# Patient Record
Sex: Male | Born: 1980 | Race: Black or African American | Hispanic: No | Marital: Single | Smoking: Current every day smoker
Health system: Southern US, Community
[De-identification: ages and names within clinical notes are randomized; demographics above are authoritative.]

## PROBLEM LIST (undated history)

## (undated) DIAGNOSIS — A6000 Herpesviral infection of urogenital system, unspecified: Secondary | ICD-10-CM

## (undated) DIAGNOSIS — F1910 Other psychoactive substance abuse, uncomplicated: Secondary | ICD-10-CM

## (undated) DIAGNOSIS — G43909 Migraine, unspecified, not intractable, without status migrainosus: Secondary | ICD-10-CM

## (undated) DIAGNOSIS — F1420 Cocaine dependence, uncomplicated: Secondary | ICD-10-CM

---

## 2012-07-31 ENCOUNTER — Emergency Department (HOSPITAL_COMMUNITY)

## 2012-07-31 ENCOUNTER — Emergency Department (HOSPITAL_COMMUNITY)
Admission: EM | Admit: 2012-07-31 | Discharge: 2012-07-31 | Disposition: A | Discharge status: Home / Self Care | Attending: Emergency Medicine | Admitting: Emergency Medicine

## 2012-07-31 ENCOUNTER — Emergency Department (HOSPITAL_COMMUNITY)
Admit: 2012-07-31 | Discharge: 2012-07-31 | Disposition: A | Discharge status: Home / Self Care | Attending: Emergency Medicine | Admitting: Emergency Medicine

## 2012-07-31 ENCOUNTER — Encounter (HOSPITAL_COMMUNITY): Payer: Self-pay | Admitting: *Deleted

## 2012-07-31 DIAGNOSIS — H538 Other visual disturbances: Secondary | ICD-10-CM | POA: Insufficient documentation

## 2012-07-31 DIAGNOSIS — N50819 Testicular pain, unspecified: Secondary | ICD-10-CM

## 2012-07-31 DIAGNOSIS — N509 Disorder of male genital organs, unspecified: Secondary | ICD-10-CM | POA: Insufficient documentation

## 2012-07-31 DIAGNOSIS — R51 Headache: Secondary | ICD-10-CM | POA: Insufficient documentation

## 2012-07-31 DIAGNOSIS — R079 Chest pain, unspecified: Secondary | ICD-10-CM | POA: Insufficient documentation

## 2012-07-31 LAB — CBC
MCV: 83.6 fL (ref 78.0–100.0)
Platelets: 195 10*3/uL (ref 150–400)
RDW: 12.7 % (ref 11.5–15.5)
WBC: 5.1 10*3/uL (ref 4.0–10.5)

## 2012-07-31 LAB — BASIC METABOLIC PANEL
Calcium: 10.1 mg/dL (ref 8.4–10.5)
Chloride: 103 mEq/L (ref 96–112)
Creatinine, Ser: 1.09 mg/dL (ref 0.50–1.35)
GFR calc Af Amer: >90 mL/min (ref >90)
GFR calc non Af Amer: 88 mL/min — ABNORMAL LOW (ref >90)

## 2012-07-31 LAB — TROPONIN I: Troponin I: <0.3 ng/mL (ref <0.30)

## 2012-07-31 MED ORDER — DEXAMETHASONE SODIUM PHOSPHATE 4 MG/ML IJ SOLN
10.0000 mg | Freq: Once | INTRAMUSCULAR | Status: AC
  Administered 2012-07-31: 10 mg via INTRAVENOUS
  Filled 2012-07-31: qty 3

## 2012-07-31 MED ORDER — IBUPROFEN 800 MG PO TABS: 800.0000 mg | ORAL_TABLET | Freq: Three times a day (TID) | ORAL | Status: DC

## 2012-07-31 MED ORDER — METOCLOPRAMIDE HCL 5 MG/ML IJ SOLN
10.0000 mg | Freq: Once | INTRAMUSCULAR | Status: AC
  Administered 2012-07-31: 10 mg via INTRAVENOUS
  Filled 2012-07-31: qty 2

## 2012-07-31 MED ORDER — SODIUM CHLORIDE 0.9 % IV SOLN
INTRAVENOUS | Status: DC
  Administered 2012-07-31: 1000 mL via INTRAVENOUS

## 2012-07-31 MED ORDER — DIPHENHYDRAMINE HCL 50 MG/ML IJ SOLN
25.0000 mg | Freq: Once | INTRAMUSCULAR | Status: AC
  Administered 2012-07-31: 25 mg via INTRAVENOUS
  Filled 2012-07-31: qty 1

## 2012-07-31 NOTE — ED Provider Notes (Signed)
Received pt at shift change.  US scrotum ordered for right testicular pain, currently pending.   US Scrotum 07/31/2012  *RADIOLOGY REPORT*  Clinical Data:  History epididymitis.  Right testicular pain.  SCROTAL ULTRASOUND DOPPLER ULTRASOUND OF THE TESTICLES  Technique: Complete ultrasound examination of the testicles, epididymis, and other scrotal structures was performed.  Color and spectral Doppler ultrasound were also utilized to evaluate blood flow to the testicles.  Comparison:  None  Findings:  Right testis:  Normal in size and homogeneous echotexture measuring 4.4 x 2.0 x 3.3 centimeters.  There are several small calcifications within the testicle.  Left testis:  Normal in size and homogeneous echotexture measuring 4.6 x 1.0 x 2.7 cm.  Small calcifications are present.  Right epididymis:  Normal size is slightly heterogeneous echo texture.  No significant hyperemia. There is small cystic lesion measuring 3 mm likely representing benign epididymal cyst.  Left epididymis:  Normal in size and slightly heterogeneous echotexture.  Hydrocele:  Small left hydrocele.  Varicocele:  None  Pulsed Doppler interrogation of both testes demonstrates low resistance flow bilaterally.  IMPRESSION:  1.  No evidence of testicular torsion or mass. 2.  Heterogeneous right and  left epididymis with no  increased flow.  Findings may represent resolving epididymitis or early epididymitis. 3.  Bilateral microcalcifications of the testicle. 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  Monthly testicular self-examination and annual physical exams are considered appropriate surveillance. If patient has other risk factors for testicular carcinoma, then referral to Urology should be considered.  (Reference:  DeCastro, et al.:  A 5-Year Followup Study of  Asymptomatic Men with Testicular Microlithiasis.  J Urol 2008; 179:1420-1423.)   Original Report Authenticated By: Genevive Bi, M.D.     Pt already taking bactrim for the past several days for testicular pain which seems to be improving per [place] above; will have pt continue it.  Pt remains stable otherwise. Will d/c back to jail.    The patient appears reasonably screened and/or stabilized for discharge and I doubt any other medical condition or other [facility] requiring further screening, evaluation, or treatment in the ED at this time prior to discharge.   Laray Anger, DO 07/31/12 1031

## 2012-07-31 NOTE — ED Notes (Signed)
Pt resting w/ eyes closed. Prison guards remain w/ pt. NAD noted. No needs voiced.

## 2012-07-31 NOTE — ED Provider Notes (Signed)
History     CSN: [id number]  Arrival date & time 07/31/12  0027   First MD Initiated Contact with Patient 07/31/12 0042      Chief Complaint  Patient presents with  . Torticollis  . Blurred Vision  . Chest Pain    (Consider location/radiation/quality/duration/timing/severity/associated sxs/prior treatment) HPI HX per PT, is incarcerated. R testicle pain for the last 2 weeks, evaluated in jail and started on Cipro. He took this for 2 days and stopped due to heart racing, CP and felt like he was having a reaction to that medication. No fevers, trauma or penile drip.  He was switched to Bactrim and has been taking that since.  Today developed HA gradual onset and worsening with some blurry vision and is brought here for evaluation. HA is throbbing and MOD to severe. No weakness or numbness. No speech or gait difficulty.   History reviewed. No pertinent past medical history.  History reviewed. No pertinent past surgical history.  No family history on file.  History  Substance Use Topics  . Smoking status: Never Smoker   . Smokeless tobacco: Not on file  . Alcohol Use: No      Review of Systems  Constitutional: Negative for fever and chills.  HENT: Negative for neck pain and neck stiffness.   Eyes: Negative for pain.  Respiratory: Negative for shortness of breath.   Cardiovascular: Positive for chest pain.  Gastrointestinal: Negative for abdominal pain.  Genitourinary: Positive for testicular pain. Negative for dysuria, flank pain, discharge, penile swelling, scrotal swelling, genital sores and penile pain.  Musculoskeletal: Negative for back pain.  Skin: Negative for rash.  Neurological: Positive for headaches.  All other systems reviewed and are negative.    Allergies  Ciprofloxacin and Keflex  Home Medications  No current outpatient prescriptions on file.  BP 167/89  Pulse 89  Temp 98.1 F (36.7 C) (Oral)  Resp 18  Ht 5\' 11"  (1.803 m)  Wt 240 lb (108.863  kg)  BMI 33.47 kg/m2  SpO2 100%  Physical Exam  Constitutional: He is oriented to person, place, and time. He appears well-developed and well-nourished.  HENT:  Head: Normocephalic and atraumatic.  Eyes: Conjunctivae normal and EOM are normal. Pupils are equal, round, and reactive to light.  Neck: Trachea normal. Neck supple. No thyromegaly present.  Cardiovascular: Normal rate, regular rhythm, S1 normal, S2 normal and normal pulses.     No systolic murmur is present   No diastolic murmur is present  Pulses:      Radial pulses are 2+ on the right side, and 2+ on the left side.  Pulmonary/Chest: Effort normal and breath sounds normal. He has no wheezes. He has no rhonchi. He has no rales. He exhibits no tenderness.  Abdominal: Soft. Normal appearance and bowel sounds are normal. There is no tenderness. There is no CVA tenderness and negative Murphy's sign.  Genitourinary:       R testicle mild tenderness, no obvious mass. No GU lesions  Musculoskeletal:       BLE:s Calves nontender, no cords or erythema, negative Homans sign  Neurological: He is alert and oriented to person, place, and time. He has normal strength. No cranial nerve deficit or sensory deficit. GCS eye subscore is 4. GCS verbal subscore is 5. GCS motor subscore is 6.  Skin: Skin is warm and dry. No rash noted. He is not diaphoretic.  Psychiatric: His speech is normal.       Cooperative and appropriate  ED Course  Procedures (including critical care time)  Results for orders placed during the hospital encounter of 07/31/12  CBC      Component Value Range   WBC 5.1  4.0 - 10.5 K/uL   RBC 5.43  4.22 - 5.81 MIL/uL   Hemoglobin 15.7  13.0 - 17.0 g/dL   HCT 16.1  09.6 - 04.5 %   MCV 83.6  78.0 - 100.0 fL   MCH 28.9  26.0 - 34.0 pg   MCHC 34.6  30.0 - 36.0 g/dL   RDW 40.9  81.1 - 91.4 %   Platelets 195  150 - 400 K/uL  BASIC METABOLIC PANEL      Component Value Range   Sodium 140  135 - 145 mEq/L   Potassium 3.4  (*) 3.5 - 5.1 mEq/L   Chloride 103  96 - 112 mEq/L   CO2 28  19 - 32 mEq/L   Glucose, Bld 82  70 - 99 mg/dL   BUN 7  6 - 23 mg/dL   Creatinine, Ser 7.82  0.50 - 1.35 mg/dL   Calcium 95.6  8.4 - 21.3 mg/dL   GFR calc non Af Amer 88 (*) >90 mL/min   GFR calc Af Amer >90  >90 mL/min  TROPONIN I      Component Value Range   Troponin I <0.30  <0.30 ng/mL   Dg Chest 2 View  07/31/2012  *RADIOLOGY REPORT*  Clinical Data: Central chest pain.  History of smoking.  CHEST - 2 VIEW  Comparison: None.  Findings: The lungs are well-aerated and clear.  There is no evidence of focal opacification, pleural effusion or pneumothorax.  The heart is normal in size; the mediastinal contour is within normal limits.  No acute osseous abnormalities are seen.  IMPRESSION: No acute cardiopulmonary process seen.  No displaced rib fractures identified.   Original Report Authenticated By: Tonia Ghent, M.D.    Ct Head Wo Contrast  07/31/2012  *RADIOLOGY REPORT*  Clinical Data: Blurred vision; chest pain.  Torticollis.  CT HEAD WITHOUT CONTRAST  Technique:  Contiguous axial images were obtained from the base of the skull through the vertex without contrast.  Comparison: None.  Findings: There is no evidence of acute infarction, mass lesion, or intra- or extra-axial hemorrhage on CT.  The posterior fossa, including the cerebellum, brainstem and fourth ventricle, is within normal limits.  The third and lateral ventricles, and basal ganglia are unremarkable in appearance.  The cerebral hemispheres are symmetric in appearance, with normal gray- white differentiation.  No mass effect or midline shift is seen.  There is no evidence of fracture; visualized osseous structures are unremarkable in appearance.  The orbits are within normal limits. The paranasal sinuses and mastoid air cells are well-aerated.  No significant soft tissue abnormalities are seen.  IMPRESSION: Unremarkable noncontrast CT of the head.   Original Report  Authenticated By: Tonia Ghent, M.D.      Date: 07/31/2012  Rate: 80  Rhythm: normal sinus rhythm  QRS Axis: normal  Intervals: normal  ST/T Wave abnormalities: nonspecific ST changes  Conduction Disutrbances:none  Narrative Interpretation:   Old EKG Reviewed: none available   IVfs and HA cocktail: decadron, benadryl, reglan  Recheck after medications - HA resolved. No change normal neuro exam.  VAs reviewed. 20/70 both eyes independently and 20/50 both eyes.   Testicle US ordered and pending at 7am.   MDM   CP, HA and testicle pain adult male currently incarcerated, on ABx  for testicle pain. Imaging. Labs. IVfs and medications as above.         Sunnie Nielsen, MD 07/31/12 [phone]

## 2012-07-31 NOTE — ED Notes (Signed)
Pt back from u/s at this time. Officers from jail at [facility] with pt. nad noted. Pt shackled to bed by officers cuffs.

## 2012-07-31 NOTE — ED Notes (Signed)
Pt reports a headache, blurred vision & chest pain. Thinking it was from an antibiotic he started & had a reaction from.

## 2014-01-30 ENCOUNTER — Encounter (HOSPITAL_COMMUNITY): Payer: Self-pay | Admitting: Emergency Medicine

## 2014-01-30 DIAGNOSIS — F17200 Nicotine dependence, unspecified, uncomplicated: Secondary | ICD-10-CM | POA: Insufficient documentation

## 2014-01-30 DIAGNOSIS — Z79899 Other long term (current) drug therapy: Secondary | ICD-10-CM | POA: Insufficient documentation

## 2014-01-30 DIAGNOSIS — I10 Essential (primary) hypertension: Secondary | ICD-10-CM | POA: Insufficient documentation

## 2014-01-30 DIAGNOSIS — N508 Other specified disorders of male genital organs: Secondary | ICD-10-CM | POA: Insufficient documentation

## 2014-01-30 DIAGNOSIS — Z8619 Personal history of other infectious and parasitic diseases: Secondary | ICD-10-CM | POA: Insufficient documentation

## 2014-01-30 NOTE — ED Notes (Signed)
Pt reports intermittent left testicle pain x 5 year; Told it was an cyst but denies follow up. States pain is now 9/10, denies taking anything. Pt also reports hematuria, dysuria, and frequency since AM. Denies abdominal pain. Denies N/V/D. Denies fever/chills.

## 2014-01-31 ENCOUNTER — Emergency Department (HOSPITAL_COMMUNITY)
Admission: EM | Admit: 2014-01-31 | Discharge: 2014-01-31 | Disposition: A | Discharge status: Home / Self Care | Attending: Emergency Medicine | Admitting: Emergency Medicine

## 2014-01-31 ENCOUNTER — Encounter (HOSPITAL_COMMUNITY): Payer: Self-pay | Admitting: Emergency Medicine

## 2014-01-31 DIAGNOSIS — N5089 Other specified disorders of the male genital organs: Secondary | ICD-10-CM

## 2014-01-31 HISTORY — DX: Herpesviral infection of urogenital system, unspecified: A60.00

## 2014-01-31 LAB — URINALYSIS, ROUTINE W REFLEX MICROSCOPIC
Bilirubin Urine: NEGATIVE
GLUCOSE, UA: NEGATIVE mg/dL
HGB URINE DIPSTICK: NEGATIVE
Ketones, ur: NEGATIVE mg/dL
LEUKOCYTES UA: NEGATIVE
Nitrite: NEGATIVE
PH: 5.5 (ref 5.0–8.0)
Protein, ur: NEGATIVE mg/dL
Specific Gravity, Urine: 1.03 (ref 1.005–1.030)
Urobilinogen, UA: 1 mg/dL (ref 0.0–1.0)

## 2014-01-31 LAB — CBG MONITORING, ED: GLUCOSE-CAPILLARY: 92 mg/dL (ref 70–99)

## 2014-01-31 MED ORDER — OXYCODONE-ACETAMINOPHEN 5-325 MG PO TABS
1.0000 | ORAL_TABLET | Freq: Once | ORAL | Status: AC
  Administered 2014-01-31: 1 via ORAL
  Filled 2014-01-31: qty 1

## 2014-01-31 MED ORDER — IBUPROFEN 800 MG PO TABS
800.0000 mg | ORAL_TABLET | Freq: Once | ORAL | Status: AC
  Administered 2014-01-31: 800 mg via ORAL
  Filled 2014-01-31: qty 1

## 2014-01-31 MED ORDER — IBUPROFEN 200 MG PO TABS
400.0000 mg | ORAL_TABLET | Freq: Once | ORAL | Status: AC
  Administered 2014-01-31: 400 mg via ORAL
  Filled 2014-01-31: qty 2

## 2014-01-31 NOTE — Discharge Instructions (Signed)
Scrotal Masses  Scrotal swelling is common in men of all ages. Common types of testicular masses include:    Hydrocele. The most common benign testicular mass in an adult. Hydroceles are generally soft and painless collections of fluid in the scrotal sac. These can rapidly change size as the fluid enters or leaves. Hydroceles can be associated with an underlying cancer of the testicle.   Spermatoceles. Generally soft and painless cyst-like masses in the scrotum that contain fluid, usually above the testicle. They can rapidly change size as the fluid enters or leaves. They are more prominent while standing or exercising. Sometimes, spermatoceles may cause a sensation of heaviness or a dull ache.   Orchitis. Inflammation of the testicle. It is painful and may be associated with a fever or symptoms of a urinary tract infection, including frequent and painful urination. It is common in males who have the mumps.   Varicocele. An enlargement of the veins that drain the testicles. Varicoceles usually occur on the left side of the scrotum. This condition can increase the risk of infertility. Varicocele is sometimes more prominent while standing or exercising. Sometimes, varicoceles may cause a sensation of heaviness or a dull ache.   Inguinal hernia. A bulge caused by a portion of intestine protruding into the scrotum through a weak area in the abdominal muscles. Hernias may or may not be painful. They are soft and usually enlarge with coughing or straining.   Torsion of the testis. This can cause a testicular mass that develops quickly and is associated with tenderness or fever, or both. It is caused by a twisting of the testicle within the sac. It also reduces the blood supply and can destroy the testis if not treated quickly with surgery.   Epididymitis. Inflammation of the epididymis (a structure attached above and behind the testicle), usually caused by a urinary tract infection or a sexually transmitted  infection. This generally shows up as testicular discomfort and swelling and may include pain during urination. It is frequently associated with a testicle infection.   Testicular appendages. Remnants of tissue on the testis present since birth. A testicular appendage can twist on its blood supply and cause pain. In most cases, this is seen as a blue dot on the scrotum.   Hematocele. A collection of blood between the layers of the sac inside the scrotum. It usually is caused by trauma to the scrotum.   Sebaceous cysts. These can be a swelling in the skin of the scrotum and are usually painless.   Cancer (carcinoma) of the skin of the scrotum. It can cause scrotal swelling, but this is rare.  Document Released: 03/15/2003 Document Revised: 05/11/2013 Document Reviewed: 02/28/2013  [employer] Patient Information 2014 [employer].

## 2014-01-31 NOTE — ED Notes (Signed)
Patient stated that he has been having trouble urinating.  Has been told that he has a cyst in his "testes" and it has really started hurting today.  Stated when he went to the [facility] he noticed some pus before he started to urinate.

## 2014-01-31 NOTE — ED Notes (Signed)
Discharge instructions reviewed with pt. Pt verbalized understanding.   

## 2014-01-31 NOTE — ED Provider Notes (Signed)
CSN: [id number]     Arrival date & time 01/30/14  2044 History   First MD Initiated Contact with Patient 01/31/14 0300         HPI Is reports intermittent left testicle pain over the past 4-5 years.  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  He had an ultrasound in 2013 demonstrated bilateral microcalcifications of his testes.  He reports some hematuria and dysuria as well as frequency without discharge from his penis.  He states the symptoms began today.  He denies pain in his testicles right now.  He reports the pain is located between his 2 testicles.  No scrotal changes per the patient.  No recent fevers or chills.  He is not diabetic.  No other complaints.  Pain is mild in severity   Past Medical History  Diagnosis Date  . Hypertension   . Genital herpes    History reviewed. No pertinent past surgical history. No family history on file. History  Substance Use Topics  . Smoking status: Current Every Day Smoker -- 0.25 packs/day    Types: Cigarettes  . Smokeless tobacco: Not on file  . Alcohol Use: No    Review of Systems  All other systems reviewed and are negative.     Allergies  Ciprofloxacin and Keflex  Home Medications   Prior to Admission medications   Medication Sig Start Date End Date Taking? Authorizing Provider  Homeopathic Products (SINUS MEDICINE PO) Take 1 tablet by mouth every 4 (four) hours as needed (sinus relief).   Yes Historical Provider, MD  ibuprofen (ADVIL,MOTRIN) 800 MG tablet Take 1 tablet (800 mg total) by mouth 3 (three) times daily. 07/31/12  Yes Sunnie NielsenBrian Opitz, MD   BP 116/71  Pulse 69  Temp(Src) 97.5 F (36.4 C) (Oral)  Resp 18  Ht 5\' 11"  (1.803 m)  Wt 250 lb (113.399 kg)  BMI 34.88 kg/m2  SpO2 98% Physical Exam  Nursing note and vitals reviewed. Constitutional: He is oriented to person, place, and time. He appears well-developed and well-nourished.  HENT:  Head: Normocephalic.  Eyes: EOM are normal.  Neck: Normal  range of motion.  Pulmonary/Chest: Effort normal.  Abdominal: He exhibits no distension.  Genitourinary:  Circumcised penis.  No tenderness of the shaft of his penis.  No scrotal swelling or erythema noted.  No scrotal warmth.  Normal left and right testicle without tenderness.  Patient with a small hard mass at the medial aspect of the left testicle but does not appear confluent with the left  testicle by physical exam  Musculoskeletal: Normal range of motion.  Neurological: He is alert and oriented to person, place, and time.  Psychiatric: He has a normal mood and affect.    ED Course  Procedures (including critical care time) Labs Review Labs Reviewed  URINALYSIS, ROUTINE W REFLEX MICROSCOPIC  CBG MONITORING, ED    Imaging Review No results found.   EKG Interpretation None      MDM   Final diagnoses:  Testicular mass    No indication for acute imaging tonight.  Doubt testicular torsion.  Doubt epididymitis.  Discharge home with urology followup.    Lyanne CoKevin M Chasidy Janak, MD 01/31/14 [phone]

## 2014-02-04 ENCOUNTER — Encounter (HOSPITAL_COMMUNITY): Payer: Self-pay | Admitting: Emergency Medicine

## 2014-02-04 ENCOUNTER — Emergency Department (HOSPITAL_COMMUNITY)

## 2014-02-04 ENCOUNTER — Emergency Department (HOSPITAL_COMMUNITY)
Admission: EM | Admit: 2014-02-04 | Discharge: 2014-02-05 | Disposition: A | Discharge status: Home / Self Care | Attending: Emergency Medicine | Admitting: Emergency Medicine

## 2014-02-04 DIAGNOSIS — I10 Essential (primary) hypertension: Secondary | ICD-10-CM | POA: Insufficient documentation

## 2014-02-04 DIAGNOSIS — R519 Headache, unspecified: Secondary | ICD-10-CM

## 2014-02-04 DIAGNOSIS — F17200 Nicotine dependence, unspecified, uncomplicated: Secondary | ICD-10-CM | POA: Insufficient documentation

## 2014-02-04 DIAGNOSIS — H53149 Visual discomfort, unspecified: Secondary | ICD-10-CM | POA: Insufficient documentation

## 2014-02-04 DIAGNOSIS — R0602 Shortness of breath: Secondary | ICD-10-CM | POA: Insufficient documentation

## 2014-02-04 DIAGNOSIS — R079 Chest pain, unspecified: Secondary | ICD-10-CM

## 2014-02-04 DIAGNOSIS — R51 Headache: Secondary | ICD-10-CM | POA: Insufficient documentation

## 2014-02-04 DIAGNOSIS — Z8619 Personal history of other infectious and parasitic diseases: Secondary | ICD-10-CM | POA: Insufficient documentation

## 2014-02-04 DIAGNOSIS — R072 Precordial pain: Secondary | ICD-10-CM | POA: Insufficient documentation

## 2014-02-04 HISTORY — DX: Migraine, unspecified, not intractable, without status migrainosus: G43.909

## 2014-02-04 LAB — CBC
HEMATOCRIT: 46.8 % (ref 39.0–52.0)
HEMOGLOBIN: 15.7 g/dL (ref 13.0–17.0)
MCH: 29 pg (ref 26.0–34.0)
MCHC: 33.5 g/dL (ref 30.0–36.0)
MCV: 86.5 fL (ref 78.0–100.0)
Platelets: 199 10*3/uL (ref 150–400)
RBC: 5.41 MIL/uL (ref 4.22–5.81)
RDW: 13.5 % (ref 11.5–15.5)
WBC: 7.5 10*3/uL (ref 4.0–10.5)

## 2014-02-04 LAB — BASIC METABOLIC PANEL
BUN: 13 mg/dL (ref 6–23)
CHLORIDE: 105 meq/L (ref 96–112)
CO2: 26 meq/L (ref 19–32)
Calcium: 10.2 mg/dL (ref 8.4–10.5)
Creatinine, Ser: 1.03 mg/dL (ref 0.50–1.35)
GFR calc Af Amer: >90 mL/min (ref >90)
GLUCOSE: 115 mg/dL — AB (ref 70–99)
POTASSIUM: 3.9 meq/L (ref 3.7–5.3)
SODIUM: 144 meq/L (ref 137–147)

## 2014-02-04 LAB — URINALYSIS, ROUTINE W REFLEX MICROSCOPIC
Bilirubin Urine: NEGATIVE
Glucose, UA: NEGATIVE mg/dL
Hgb urine dipstick: NEGATIVE
Ketones, ur: NEGATIVE mg/dL
Leukocytes, UA: NEGATIVE
NITRITE: NEGATIVE
PROTEIN: NEGATIVE mg/dL
SPECIFIC GRAVITY, URINE: 1.023 (ref 1.005–1.030)
UROBILINOGEN UA: 1 mg/dL (ref 0.0–1.0)
pH: 6 (ref 5.0–8.0)

## 2014-02-04 LAB — I-STAT TROPONIN, ED: TROPONIN I, POC: 0 ng/mL (ref 0.00–0.08)

## 2014-02-04 MED ORDER — SODIUM CHLORIDE 0.9 % IV BOLUS (SEPSIS)
1000.0000 mL | Freq: Once | INTRAVENOUS | Status: AC
  Administered 2014-02-04: 1000 mL via INTRAVENOUS

## 2014-02-04 MED ORDER — ACETAMINOPHEN 500 MG PO TABS
1000.0000 mg | ORAL_TABLET | Freq: Once | ORAL | Status: AC
  Administered 2014-02-04: 1000 mg via ORAL
  Filled 2014-02-04: qty 2

## 2014-02-04 NOTE — ED Notes (Signed)
Pt reports mid chest pains today, states his pain increases when drinking liquids. Has been working out in the heat today, also having a headache, hx of migraines. Denies n/v. No acute distress noted at triage, ekg done. Airway intact.

## 2014-02-04 NOTE — ED Notes (Addendum)
Pt. said that in prison, a doctor stated he had symptoms of TB/ high WBC count. Information relayed to [employer].

## 2014-02-04 NOTE — Discharge Instructions (Signed)
Chest Wall Pain Chest wall pain is pain in or around the bones and muscles of your chest. It may take up to 6 weeks to get better. It may take longer if you must stay physically active in your work and activities.  CAUSES  Chest wall pain may happen on its own. However, it may be caused by:  A viral illness like the flu.  Injury.  Coughing.  Exercise.  Arthritis.  Fibromyalgia.  Shingles. HOME CARE INSTRUCTIONS   Avoid overtiring physical activity. Try not to strain or perform activities that cause pain. This includes any activities using your chest or your abdominal and side muscles, especially if heavy weights are used.  Put ice on the sore area.  Put ice in a plastic bag.  Place a towel between your skin and the bag.  Leave the ice on for 15-20 minutes per hour while awake for the first 2 days.  Only take over-the-counter or prescription medicines for pain, discomfort, or fever as directed by your caregiver. SEEK IMMEDIATE MEDICAL CARE IF:   Your pain increases, or you are very uncomfortable.  You have a fever.  Your chest pain becomes worse.  You have new, unexplained symptoms.  You have nausea or vomiting.  You feel sweaty or lightheaded.  You have a cough with phlegm (sputum), or you cough up blood. MAKE SURE YOU:   Understand these instructions.  Will watch your condition.  Will get help right away if you are not doing well or get worse. Document Released: 09/08/2005 Document Revised: 12/01/2011 Document Reviewed: 05/05/2011 [facility] Patient Information 2014 [place], [place].  Migraine Headache A migraine headache is an intense, throbbing pain on one or both sides of your head. A migraine can last for 30 minutes to several hours. CAUSES  The exact cause of a migraine headache is not always known. However, a migraine may be caused when nerves in the brain become irritated and release chemicals that cause inflammation. This causes pain. Certain things  may also trigger migraines, such as:  Alcohol.  Smoking.  Stress.  Menstruation.  Aged cheeses.  Foods or drinks that contain nitrates, glutamate, aspartame, or tyramine.  Lack of sleep.  Chocolate.  Caffeine.  Hunger.  Physical exertion.  Fatigue.  Medicines used to treat chest pain (nitroglycerine), birth control pills, estrogen, and some blood pressure medicines. SIGNS AND SYMPTOMS  Pain on one or both sides of your head.  Pulsating or throbbing pain.  Severe pain that prevents daily activities.  Pain that is aggravated by any physical activity.  Nausea, vomiting, or both.  Dizziness.  Pain with exposure to bright lights, loud noises, or activity.  General sensitivity to bright lights, loud noises, or smells. Before you get a migraine, you may get warning signs that a migraine is coming (aura). An aura may include:  Seeing flashing lights.  Seeing bright spots, halos, or zig-zag lines.  Having tunnel vision or blurred vision.  Having feelings of numbness or tingling.  Having trouble talking.  Having muscle weakness. DIAGNOSIS  A migraine headache is often diagnosed based on:  Symptoms.  Physical exam.  A CT scan or MRI of your head. These imaging tests cannot diagnose migraines, but they can help rule out other causes of headaches. TREATMENT Medicines may be given for pain and nausea. Medicines can also be given to help prevent recurrent migraines.  HOME CARE INSTRUCTIONS  Only take over-the-counter or prescription medicines for pain or discomfort as directed by your health care provider. The  use of long-term narcotics is not recommended.  Lie down in a dark, quiet room when you have a migraine.  Keep a journal to find out what may trigger your migraine headaches. For example, write down:  What you eat and drink.  How much sleep you get.  Any change to your diet or medicines.  Limit alcohol consumption.  Quit smoking if you  smoke.  Get 7 9 hours of sleep, or as recommended by your health care provider.  Limit stress.  Keep lights dim if bright lights bother you and make your migraines worse. SEEK IMMEDIATE MEDICAL CARE IF:   Your migraine becomes severe.  You have a fever.  You have a stiff neck.  You have vision loss.  You have muscular weakness or loss of muscle control.  You start losing your balance or have trouble walking.  You feel faint or pass out.  You have severe symptoms that are different from your first symptoms. MAKE SURE YOU:   Understand these instructions.  Will watch your condition.  Will get help right away if you are not doing well or get worse. Document Released: 09/08/2005 Document Revised: 06/29/2013 Document Reviewed: 05/16/2013 [facility] LLCExitCare Patient Information 2014 [place]ExitCare, MarylandLLC.

## 2014-02-04 NOTE — ED Provider Notes (Signed)
CSN: [id number]     Arrival date & time 02/04/14  1849 History   First MD Initiated Contact with Patient 02/04/14 2108     Chief Complaint  Patient presents with  . Chest Pain  . Headache     (Consider location/radiation/quality/duration/timing/severity/associated sxs/prior Treatment) Patient is a 33 y.o. male presenting with chest pain and headaches.  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 Ineffective treatments:  None tried Associated symptoms: headache and shortness of breath ("I think it's because I'm overweignt")   Associated symptoms: no cough, no dizziness, no fever and no nausea   Headache Pain location:  L temporal and R temporal Quality:  Dull Radiates to:  Does not radiate Severity currently:  8/10 Onset quality:  Gradual Duration:  1 day Timing:  Constant Progression:  Unchanged Chronicity:  Recurrent Context comment:  Being outside all day Relieved by:  Nothing Worsened by:  Nothing tried Associated symptoms: photophobia   Associated symptoms: no blurred vision, no cough, no dizziness, no fever and no nausea     Past Medical History  Diagnosis Date  . Hypertension   . Genital herpes   . Migraines    History reviewed. No pertinent past surgical history. History reviewed. No pertinent family history. History  Substance Use Topics  . Smoking status: Current Every Day Smoker -- 0.25 packs/day    Types: Cigarettes  . Smokeless tobacco: Not on file  . Alcohol Use: No    Review of Systems  Constitutional: Negative for fever.  Eyes: Positive for photophobia. Negative for blurred vision.  Respiratory: Positive for  shortness of breath ("I think it's because I'm overweignt"). Negative for cough.   Cardiovascular: Positive for chest pain.  Gastrointestinal: Negative for nausea.  Neurological: Positive for headaches. Negative for dizziness.  All other systems reviewed and are negative.     Allergies  Ciprofloxacin and Keflex  Home Medications   Prior to Admission medications   Not on File   BP 121/81  Pulse 81  Temp(Src) 98.5 F (36.9 C) (Oral)  Resp 20  SpO2 99% Physical Exam  Nursing note and vitals reviewed. Constitutional: He is oriented to person, place, and time. He appears well-developed and well-nourished. No distress.  HENT:  Head: Normocephalic and atraumatic.  Mouth/Throat: Oropharynx is clear and moist.  Eyes: Conjunctivae are normal. Pupils are equal, round, and reactive to light. No scleral icterus.  Neck: Neck supple.  Cardiovascular: Normal rate, regular rhythm, normal heart sounds and intact distal pulses.   No murmur heard. Pulmonary/Chest: Effort normal and breath sounds normal. No stridor. No respiratory distress. He has no wheezes. He has no rales. He exhibits tenderness (sternal palpation).  Abdominal: Soft. He exhibits no distension. There is no tenderness. There is no rebound and no guarding.  Musculoskeletal: Normal range of motion. He exhibits no edema.  Neurological: He is alert and oriented to person, place, and time. He has normal strength. No cranial nerve deficit or sensory deficit. Coordination normal. GCS eye subscore is 4. GCS verbal subscore is 5. GCS motor subscore is 6.  Skin: Skin is warm and dry. No rash noted.  Psychiatric: He has a normal mood and affect. His  behavior is normal.    ED Course  Procedures (including critical care time) Labs Review Labs Reviewed  BASIC METABOLIC PANEL - Abnormal; Notable for the following:    Glucose, Bld 115 (*)    All other components within normal limits  GC/CHLAMYDIA PROBE AMP  CBC  URINALYSIS, ROUTINE W  REFLEX MICROSCOPIC  I-STAT TROPOININ, ED    Imaging Review Dg Chest 2 View  02/04/2014   CLINICAL DATA:  Chest pain and headache  EXAM: CHEST  2 VIEW  COMPARISON:  07/31/2012.  FINDINGS: The heart size and mediastinal contours are within normal limits. Both lungs are clear. The visualized skeletal structures are unremarkable.  IMPRESSION: No active cardiopulmonary disease.   Electronically Signed   By: Signa Kellaylor  Stroud M.D.   On: 02/04/2014 19:36  All radiology studies independently viewed by me.      EKG Interpretation   Date/Time:  Saturday Feb 04 2014 18:52:06 EDT Ventricular Rate:  105 PR Interval:  156 QRS Duration: 88 QT Interval:  322 QTC Calculation: 425 R Axis:   103 Text Interpretation:  Sinus tachycardia Rightward axis Borderline ECG  compared to prior, axis more rightward Confirmed by [facility]  MD, TREY  ([id number]) on 02/04/2014 9:25:34 PM      MDM   Final diagnoses:  Chest pain  Headache    33 year old male presenting with 2 complaints including chest pain and a headache. He describes the headache as consistent with his frequent migraine headaches. No neurologic symptoms, normal neurologic exam, no fevers, don't suspect meningitis or subarachnoid hemorrhage. His chest pain is reproducible to palpation and is also described as recurrent. Examined history is consistent with musculoskeletal chest pain. Story not consistent with ACS, PE, or dissection. His pain resolved after a bolus of IV fluids and Tylenol. Discharged with return precautions.    Merrie RoofJohn David Kemonte Ullman III, MD 02/04/14 [phone]

## 2014-02-10 ENCOUNTER — Emergency Department (INDEPENDENT_AMBULATORY_CARE_PROVIDER_SITE_OTHER)
Admission: EM | Admit: 2014-02-10 | Discharge: 2014-02-10 | Disposition: A | Discharge status: Home / Self Care | Payer: Self-pay | Source: Home / Self Care | Attending: Family Medicine | Admitting: Family Medicine

## 2014-02-10 ENCOUNTER — Encounter (HOSPITAL_COMMUNITY): Payer: Self-pay | Admitting: Emergency Medicine

## 2014-02-10 DIAGNOSIS — A630 Anogenital (venereal) warts: Secondary | ICD-10-CM

## 2014-02-10 MED ORDER — IMIQUIMOD 5 % EX CREA: TOPICAL_CREAM | CUTANEOUS | Status: DC

## 2014-02-10 NOTE — ED Notes (Signed)
Reports having an "outbreak".    Blisters on genital area.   Noticed yesterday.

## 2014-02-10 NOTE — ED Provider Notes (Signed)
CSN: [id number]     Arrival date & time 02/10/14  0813 History   First MD Initiated Contact with Patient 02/10/14 [phone]     Chief Complaint  Patient presents with  . Herpes Zoster    reports "having an outbreak"   (Consider location/radiation/quality/duration/timing/severity/associated sxs/prior Treatment) Patient is a 33 y.o. male presenting with rash. The history is provided by the patient.  Rash Location:  Ano-genital Ano-genital rash location:  Penis Quality: blistering   Severity:  Mild Onset quality:  Gradual Duration:  1 day Progression:  Unchanged Chronicity:  New Relieved by:  None tried Worsened by:  Nothing tried Ineffective treatments:  None tried   Past Medical History  Diagnosis Date  . Hypertension   . Genital herpes   . Migraines    History reviewed. No pertinent past surgical history. History reviewed. No pertinent family history. History  Substance Use Topics  . Smoking status: Current Every Day Smoker -- 0.25 packs/day    Types: Cigarettes  . Smokeless tobacco: Not on file  . Alcohol Use: No    Review of Systems  Constitutional: Negative.   Genitourinary: Negative.  Negative for discharge, penile swelling, scrotal swelling, penile pain and testicular pain.  Skin: Positive for rash.    Allergies  Ciprofloxacin and Keflex  Home Medications   Prior to Admission medications   Not on File   BP 131/75  Pulse 82  Temp(Src) 98.9 F (37.2 C) (Oral)  Resp 20  SpO2 99% Physical Exam  Nursing note and vitals reviewed. Constitutional: He is oriented to person, place, and time. He appears well-developed and well-nourished.  Genitourinary: Testes normal.    Circumcised. No penile tenderness.  Neurological: He is alert and oriented to person, place, and time.  Skin: Skin is warm and dry.    ED Course  Procedures (including critical care time) Labs Review Labs Reviewed - No data to display  Imaging Review No results found.   MDM   1.  Genital warts        Linna Hoff, MD 02/10/14 [phone]

## 2014-03-06 ENCOUNTER — Encounter (HOSPITAL_COMMUNITY): Payer: Self-pay | Admitting: Emergency Medicine

## 2014-03-06 ENCOUNTER — Emergency Department (INDEPENDENT_AMBULATORY_CARE_PROVIDER_SITE_OTHER)
Admission: EM | Admit: 2014-03-06 | Discharge: 2014-03-06 | Disposition: A | Discharge status: Hospice | Payer: Self-pay | Source: Home / Self Care | Attending: Family Medicine | Admitting: Family Medicine

## 2014-03-06 ENCOUNTER — Emergency Department (HOSPITAL_COMMUNITY)
Admission: EM | Admit: 2014-03-06 | Discharge: 2014-03-07 | Disposition: A | Discharge status: Home / Self Care | Payer: Self-pay | Attending: Emergency Medicine | Admitting: Emergency Medicine

## 2014-03-06 ENCOUNTER — Emergency Department (HOSPITAL_COMMUNITY): Payer: Self-pay

## 2014-03-06 DIAGNOSIS — R079 Chest pain, unspecified: Secondary | ICD-10-CM | POA: Insufficient documentation

## 2014-03-06 DIAGNOSIS — L578 Other skin changes due to chronic exposure to nonionizing radiation: Secondary | ICD-10-CM | POA: Insufficient documentation

## 2014-03-06 DIAGNOSIS — Z8679 Personal history of other diseases of the circulatory system: Secondary | ICD-10-CM | POA: Insufficient documentation

## 2014-03-06 DIAGNOSIS — R42 Dizziness and giddiness: Secondary | ICD-10-CM | POA: Insufficient documentation

## 2014-03-06 DIAGNOSIS — Z8619 Personal history of other infectious and parasitic diseases: Secondary | ICD-10-CM | POA: Insufficient documentation

## 2014-03-06 DIAGNOSIS — E860 Dehydration: Secondary | ICD-10-CM | POA: Insufficient documentation

## 2014-03-06 DIAGNOSIS — F17200 Nicotine dependence, unspecified, uncomplicated: Secondary | ICD-10-CM | POA: Insufficient documentation

## 2014-03-06 LAB — COMPREHENSIVE METABOLIC PANEL
ALK PHOS: 69 U/L (ref 39–117)
ALT: 43 U/L (ref 0–53)
AST: 45 U/L — AB (ref 0–37)
Albumin: 4.1 g/dL (ref 3.5–5.2)
BUN: 7 mg/dL (ref 6–23)
CO2: 25 meq/L (ref 19–32)
Calcium: 9.6 mg/dL (ref 8.4–10.5)
Chloride: 100 mEq/L (ref 96–112)
Creatinine, Ser: 1.03 mg/dL (ref 0.50–1.35)
GFR calc non Af Amer: >90 mL/min (ref >90)
GLUCOSE: 94 mg/dL (ref 70–99)
POTASSIUM: 3.6 meq/L — AB (ref 3.7–5.3)
SODIUM: 139 meq/L (ref 137–147)
Total Bilirubin: 0.6 mg/dL (ref 0.3–1.2)
Total Protein: 7.3 g/dL (ref 6.0–8.3)

## 2014-03-06 LAB — CBC WITH DIFFERENTIAL/PLATELET
Basophils Absolute: 0 10*3/uL (ref 0.0–0.1)
Basophils Relative: 0 % (ref 0–1)
Eosinophils Absolute: 0.1 10*3/uL (ref 0.0–0.7)
Eosinophils Relative: 1 % (ref 0–5)
HEMATOCRIT: 44.4 % (ref 39.0–52.0)
Hemoglobin: 14.8 g/dL (ref 13.0–17.0)
LYMPHS PCT: 30 % (ref 12–46)
Lymphs Abs: 2.3 10*3/uL (ref 0.7–4.0)
MCH: 28.9 pg (ref 26.0–34.0)
MCHC: 33.3 g/dL (ref 30.0–36.0)
MCV: 86.7 fL (ref 78.0–100.0)
MONO ABS: 0.6 10*3/uL (ref 0.1–1.0)
MONOS PCT: 7 % (ref 3–12)
NEUTROS ABS: 4.6 10*3/uL (ref 1.7–7.7)
Neutrophils Relative %: 62 % (ref 43–77)
Platelets: 145 10*3/uL — ABNORMAL LOW (ref 150–400)
RBC: 5.12 MIL/uL (ref 4.22–5.81)
RDW: 13.6 % (ref 11.5–15.5)
WBC: 7.5 10*3/uL (ref 4.0–10.5)

## 2014-03-06 LAB — I-STAT TROPONIN, ED: Troponin i, poc: 0.01 ng/mL (ref 0.00–0.08)

## 2014-03-06 LAB — LIPASE, BLOOD: Lipase: 24 U/L (ref 11–59)

## 2014-03-06 MED ORDER — NITROGLYCERIN 0.4 MG SL SUBL
SUBLINGUAL_TABLET | SUBLINGUAL | Status: AC
  Filled 2014-03-06: qty 1

## 2014-03-06 MED ORDER — SODIUM CHLORIDE 0.9 % IV SOLN: Freq: Once | INTRAVENOUS | Status: DC

## 2014-03-06 MED ORDER — NITROGLYCERIN 0.4 MG SL SUBL
0.4000 mg | SUBLINGUAL_TABLET | SUBLINGUAL | Status: DC | PRN
  Administered 2014-03-06: 0.4 mg via SUBLINGUAL

## 2014-03-06 MED ORDER — ACETAMINOPHEN 325 MG PO TABS
650.0000 mg | ORAL_TABLET | Freq: Once | ORAL | Status: AC
  Administered 2014-03-06: 650 mg via ORAL
  Filled 2014-03-06: qty 2

## 2014-03-06 MED ORDER — ASPIRIN 81 MG PO CHEW
CHEWABLE_TABLET | ORAL | Status: AC
  Filled 2014-03-06: qty 1

## 2014-03-06 MED ORDER — ASPIRIN 81 MG PO CHEW
324.0000 mg | CHEWABLE_TABLET | Freq: Once | ORAL | Status: AC
  Administered 2014-03-06: 324 mg via ORAL

## 2014-03-06 MED ORDER — SODIUM CHLORIDE 0.9 % IV BOLUS (SEPSIS)
1000.0000 mL | Freq: Once | INTRAVENOUS | Status: AC
  Administered 2014-03-06: 1000 mL via INTRAVENOUS

## 2014-03-06 NOTE — ED Notes (Signed)
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 Patient reports his vision got blurry and is still blurry. Pt is alert and oriented and in  No acute distress.

## 2014-03-06 NOTE — ED Provider Notes (Signed)
CSN: [id number]     Arrival date & time 03/06/14  2037 History   First MD Initiated Contact with Patient 03/06/14 2101     Chief Complaint  Patient presents with  . Chest Pain  . Dehydration     (Consider location/radiation/quality/duration/timing/severity/associated sxs/prior Treatment) HPI  Patient presents to the ER for complaints of having chest pain with dizziness and diffuse muscle cramping. He was  Recently released from prison 2 months ago after being their for 10 years. [facility] as he is a recovered Cocaine addict. His friend gave him a 2 x 30 mg pill of Adderrall this morning to help him stay awake since was feeling tired from staying up all night. He denies knowing taking someone elses prescription medication is illegal and denies knowing that the contains amphetamines. 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 He says that he did not eat or hardly drink anything all day. Does not disclose why this was. His symptoms include pain in his chest, feeling tired/ weak, headache- global, muscle cramping. He has a PMH positive for  Hypertension, migraines and herpes. Pt says he had problems with his heart from doing cocaine a long time ago but denies ever having an MI or being told that he has CVA or PAD.      Past Medical History  Diagnosis Date  . Genital herpes   . Migraines    No past surgical history on file. No family history on file. History  Substance Use Topics  . Smoking status: Current Every Day Smoker -- 0.25 packs/day    Types: Cigarettes  . Smokeless tobacco: Not on file  . Alcohol Use: No    Review of Systems  All other systems reviewed and are negative.      Allergies  Ciprofloxacin and Keflex  Home Medications   Prior to Admission medications   Not on File   BP 142/76  Pulse 96  Temp(Src) 98.7 F (37.1 C) (Oral)  Resp 19  Ht 5\' 11"  (1.803 m)  Wt 260 lb (117.935 kg)  BMI 36.28 kg/m2  SpO2  100% Physical Exam  Nursing note and vitals reviewed. Constitutional: He appears well-developed and well-nourished. No distress.  Pts skin is sun burned to the face and arms.  HENT:  Head: Normocephalic and atraumatic.  Eyes: Pupils are equal, round, and reactive to light.  Neck: Normal range of motion. Neck supple.  Cardiovascular: Normal rate and regular rhythm.   Pulmonary/Chest: Effort normal.  Abdominal: Soft.  Musculoskeletal:  Diffuse muscle tightness  Neurological: He is alert.  Skin: Skin is warm and dry.    ED Course  Procedures (including critical care time) Labs Review Labs Reviewed  URINE RAPID DRUG SCREEN (HOSP PERFORMED) - Abnormal; Notable for the following:    Amphetamines POSITIVE (*)    All other components within normal limits  CBC WITH DIFFERENTIAL - Abnormal; Notable for the following:    Platelets 145 (*)    All other components within normal limits  COMPREHENSIVE METABOLIC PANEL - Abnormal; Notable for the following:    Potassium 3.6 (*)    AST 45 (*)    All other components within normal limits  LIPASE, BLOOD  I-STAT TROPOININ, ED    Imaging Review Dg Chest 2 View  03/06/2014   CLINICAL DATA:  Chest pain after taking unknown pills.  EXAM: CHEST  2 VIEW  COMPARISON:  Chest radiograph performed 02/04/2014  FINDINGS: The lungs are  well-aerated and clear. There is no evidence of focal opacification, pleural effusion or pneumothorax.  The heart is normal in size; the mediastinal contour is within normal limits. No acute osseous abnormalities are seen.  IMPRESSION: No acute cardiopulmonary process seen.   Electronically Signed   By: Roanna RaiderJeffery  Chang M.D.   On: 03/06/2014 22:36     EKG Interpretation   Date/Time:  Monday March 06 2014 20:43:39 EDT Ventricular Rate:  87 PR Interval:  167 QRS Duration: 96 QT Interval:  366 QTC Calculation: 440 R Axis:   79 Text Interpretation:  Sinus rhythm since last tracing no significant  change Confirmed by Effie ShyWENTZ   MD, ELLIOTT ([id number]) on 03/06/2014 8:57:32 PM      MDM   Final diagnoses:  Dehydration    Patient given fluids and Tylenol. Advised that taking someone elses prescription medication is illegal. He denies knowing that Adderall would give him a positive urine test. He didn't eat or drink all day today and the temperature in the area today is in the upper 90's. He has sunburns to his face and arms. I believe that this is the etiology of his symptoms. After the interventions he reports feeling much better and now  Denies having any remaining symptoms. He was monitored for 2 hours. His vital signs are stable, he ambulated and tolerated oral challenge. Pt safe to dc at this time given PCP referral information.  33 y.o.Broadus JohnWarren Smallman's evaluation in the Emergency Department is complete. It has been determined that no acute conditions requiring further emergency intervention are present at this time. The patient/guardian have been advised of the diagnosis and plan. We have discussed signs and symptoms that warrant return to the ED, such as changes or worsening in symptoms.  Vital signs are stable at discharge. Filed Vitals:   03/07/14 0015  BP: 142/76  Pulse: 96  Temp:   Resp: 19    Patient/guardian has voiced understanding and agreed to follow-up with the PCP or specialist.     Dorthula Matasiffany G Mickayla Trouten, PA-C 03/07/14 2352

## 2014-03-06 NOTE — ED Notes (Signed)
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  Sent from [facility] where they gave him 324mg  ASA and 0.4mg  SL nitroglycerin, no relief of symptoms

## 2014-03-06 NOTE — ED Notes (Signed)
Patient transported to X-ray 

## 2014-03-06 NOTE — ED Provider Notes (Signed)
CSN: [id number]     Arrival date & time 03/06/14  1750 History   First MD Initiated Contact with Patient 03/06/14 1910     Chief Complaint  Patient presents with  . Heat Exposure   (Consider location/radiation/quality/duration/timing/severity/associated sxs/prior Treatment) HPI Comments: 33 year old male presents for evaluation of chest pain. Joshua Larson reports that this started about 4:00 this afternoon, about 3-1/2 hours ago. 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  Joshua Larson first started to have numbness in his hands, followed soon thereafter by difficulty breathing. Joshua Larson then started to have a headache and severe pain behind his eyes, palpitations, substernal chest pain, nausea, cramps in his leg and stomach, and then cramps in the muscles of his neck. His symptoms have persisted up until now. Joshua Larson rates his chest pain as 9/10 in severity, constant, and located in the mid chest. Joshua Larson is also having cramping pain in his right and left chest. All of his symptoms are still present at this time. Joshua Larson has no history of heart problems. Joshua Larson did take a 30 mg Adderall this morning, Joshua Larson says this was for energy, Joshua Larson is not prescribed Adderall. Joshua Larson denies any vomiting.   Past Medical History  Diagnosis Date  . Hypertension   . Genital herpes   . Migraines    History reviewed. No pertinent past surgical history. No family history on file. History  Substance Use Topics  . Smoking status: Current Every Day Smoker -- 0.25 packs/day    Types: Cigarettes  . Smokeless tobacco: Not on file  . Alcohol Use: No    Review of Systems  Constitutional: Positive for chills and fatigue. Negative for fever.  Eyes: Positive for photophobia, pain and visual disturbance.  Respiratory: Positive for chest tightness and shortness of breath.   Cardiovascular: Positive for chest pain and palpitations.  Gastrointestinal: Positive for nausea and abdominal pain.  Musculoskeletal:  Positive for back pain, myalgias, neck pain and neck stiffness.       Cramps in legs  Neurological: Positive for dizziness, numbness and headaches.  All other systems reviewed and are negative.   Allergies  Ciprofloxacin and Keflex  Home Medications   Prior to Admission medications   Medication Sig Start Date End Date Taking? Authorizing Provider  imiquimod (ALDARA) 5 % cream Apply topically 3 (three) times a week. 02/10/14   Linna HoffJames D Kindl, MD   There were no vitals taken for this visit. Physical Exam  Nursing note and vitals reviewed. Constitutional: Joshua Larson is oriented to person, place, and time. Joshua Larson appears well-developed and well-nourished. No distress.  HENT:  Head: Normocephalic.  Eyes: Right eye exhibits no discharge. Left eye exhibits no discharge. Right conjunctiva is injected. Left conjunctiva is injected.  Neck: Normal range of motion. Neck supple.  Cardiovascular: Normal rate, regular rhythm and normal heart sounds.  Exam reveals no gallop and no friction rub.   No murmur heard. Pulmonary/Chest: Effort normal and breath sounds normal. No respiratory distress. Joshua Larson has no wheezes. Joshua Larson has no rales. Joshua Larson exhibits tenderness (diffuse).  Neurological: Joshua Larson is alert and oriented to person, place, and time. A cranial nerve deficit (unable to perform extraocular movements) is present. Coordination normal.  Grip strength is very weak, equal bilaterally  Skin: Skin is warm and dry. No rash noted. Joshua Larson is not diaphoretic.  Psychiatric: Joshua Larson has a normal mood and affect. Judgment normal.    ED Course  Procedures (including critical care time) Labs  Review Labs Reviewed - No data to display  Imaging Review No results found.  EKG is normal, nonischemic  MDM   1. Chest pain    Patient with a number of symptoms, including chest pain, shortness of breath, and nausea. 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 Joshua Larson needs further evaluation of this chest pain. Transferred to the emergency department  via carelink   EKG obtained, IV access obtained, patient given aspirin and nitroglycerin, oxygen via nasal cannula, cardiac monitoring  Graylon GoodZachary H Tasheena Wambolt, PA-C 03/06/14 1952

## 2014-03-07 LAB — RAPID URINE DRUG SCREEN, HOSP PERFORMED
Amphetamines: POSITIVE — AB
Barbiturates: NOT DETECTED
Benzodiazepines: NOT DETECTED
Cocaine: NOT DETECTED
Opiates: NOT DETECTED
Tetrahydrocannabinol: NOT DETECTED

## 2014-03-07 NOTE — Discharge Instructions (Signed)
Dehydration, Adult Dehydration is when you lose more fluids from the body than you take in. Vital organs like the kidneys, brain, and heart cannot function without a proper amount of fluids and salt. Any loss of fluids from the body can cause dehydration.  CAUSES   Vomiting.  Diarrhea.  Excessive sweating.  Excessive urine output.  Fever. SYMPTOMS  Mild dehydration  Thirst.  Dry lips.  Slightly dry mouth. Moderate dehydration  Very dry mouth.  Sunken eyes.  Skin does not bounce back quickly when lightly pinched and released.  Dark urine and decreased urine production.  Decreased tear production.  Headache. Severe dehydration  Very dry mouth.  Extreme thirst.  Rapid, weak pulse (more than 100 beats per minute at rest).  Cold hands and feet.  Not able to sweat in spite of heat and temperature.  Rapid breathing.  Blue lips.  Confusion and lethargy.  Difficulty being awakened.  Minimal urine production.  No tears. DIAGNOSIS  Your caregiver will diagnose dehydration based on your symptoms and your exam. Blood and urine tests will help confirm the diagnosis. The diagnostic evaluation should also identify the cause of dehydration. TREATMENT  Treatment of mild or moderate dehydration can often be done at home by increasing the amount of fluids that you drink. It is best to drink small amounts of fluid more often. Drinking too much at one time can make vomiting worse. Refer to the home care instructions below. Severe dehydration needs to be treated at the hospital where you will probably be given intravenous (IV) fluids that contain water and electrolytes. HOME CARE INSTRUCTIONS   Ask your caregiver about specific rehydration instructions.  Drink enough fluids to keep your urine clear or pale yellow.  Drink small amounts frequently if you have nausea and vomiting.  Eat as you normally do.  Avoid:  Foods or drinks high in sugar.  Carbonated  drinks.  Juice.  Extremely hot or cold fluids.  Drinks with caffeine.  Fatty, greasy foods.  Alcohol.  Tobacco.  Overeating.  Gelatin desserts.  Wash your hands well to avoid spreading bacteria and viruses.  Only take over-the-counter or prescription medicines for pain, discomfort, or fever as directed by your caregiver.  Ask your caregiver if you should continue all prescribed and over-the-counter medicines.  Keep all follow-up appointments with your caregiver. SEEK MEDICAL CARE IF:  You have abdominal pain and it increases or stays in one area (localizes).  You have a rash, stiff neck, or severe headache.  You are irritable, sleepy, or difficult to awaken.  You are weak, dizzy, or extremely thirsty. SEEK IMMEDIATE MEDICAL CARE IF:   You are unable to keep fluids down or you get worse despite treatment.  You have frequent episodes of vomiting or diarrhea.  You have blood or green matter (bile) in your vomit.  You have blood in your stool or your stool looks black and tarry.  You have not urinated in 6 to 8 hours, or you have only urinated a small amount of very dark urine.  You have a fever.  You faint. MAKE SURE YOU:   Understand these instructions.  Will watch your condition.  Will get help right away if you are not doing well or get worse. Document Released: 09/08/2005 Document Revised: 12/01/2011 Document Reviewed: 04/28/2011 Exit[employer] Patient Information 2014 [employer].  

## 2014-03-07 NOTE — ED Provider Notes (Signed)
Medical screening examination/treatment/procedure(s) were performed by resident physician or non-physician practitioner and as supervising physician I was immediately available for consultation/collaboration.   Susann Lawhorne DOUGLAS MD.   Eron Goble D Jari Dipasquale, MD 03/07/14 1719 

## 2014-03-07 NOTE — ED Notes (Signed)
Pt verbalizes understanding of d/c instructions and denies any further needs at this time. 

## 2014-03-08 NOTE — ED Provider Notes (Signed)
Medical screening examination/treatment/procedure(s) were performed by non-physician practitioner and as supervising physician I was immediately available for consultation/collaboration.  Shawn Carattini L Farin Buhman, MD 03/08/14 0013 

## 2014-04-03 ENCOUNTER — Encounter (HOSPITAL_COMMUNITY): Payer: Self-pay | Admitting: Emergency Medicine

## 2014-04-03 ENCOUNTER — Emergency Department (HOSPITAL_COMMUNITY)
Admission: EM | Admit: 2014-04-03 | Discharge: 2014-04-03 | Disposition: A | Discharge status: Home / Self Care | Payer: Self-pay | Attending: Emergency Medicine | Admitting: Emergency Medicine

## 2014-04-03 DIAGNOSIS — M25519 Pain in unspecified shoulder: Secondary | ICD-10-CM | POA: Insufficient documentation

## 2014-04-03 DIAGNOSIS — M2550 Pain in unspecified joint: Secondary | ICD-10-CM

## 2014-04-03 DIAGNOSIS — E860 Dehydration: Secondary | ICD-10-CM | POA: Insufficient documentation

## 2014-04-03 DIAGNOSIS — Z8679 Personal history of other diseases of the circulatory system: Secondary | ICD-10-CM | POA: Insufficient documentation

## 2014-04-03 DIAGNOSIS — H109 Unspecified conjunctivitis: Secondary | ICD-10-CM

## 2014-04-03 DIAGNOSIS — Z8619 Personal history of other infectious and parasitic diseases: Secondary | ICD-10-CM | POA: Insufficient documentation

## 2014-04-03 DIAGNOSIS — F17200 Nicotine dependence, unspecified, uncomplicated: Secondary | ICD-10-CM | POA: Insufficient documentation

## 2014-04-03 DIAGNOSIS — G4720 Circadian rhythm sleep disorder, unspecified type: Secondary | ICD-10-CM | POA: Insufficient documentation

## 2014-04-03 DIAGNOSIS — M25569 Pain in unspecified knee: Secondary | ICD-10-CM | POA: Insufficient documentation

## 2014-04-03 LAB — I-STAT CHEM 8, ED
BUN: 6 mg/dL (ref 6–23)
BUN: 6 mg/dL (ref 6–23)
CALCIUM ION: 1.3 mmol/L — AB (ref 1.12–1.23)
CALCIUM ION: 1.28 mmol/L — AB (ref 1.12–1.23)
CHLORIDE: 107 meq/L (ref 96–112)
CREATININE: 1.4 mg/dL — AB (ref 0.50–1.35)
CREATININE: 1.3 mg/dL (ref 0.50–1.35)
Chloride: 106 mEq/L (ref 96–112)
GLUCOSE: 100 mg/dL — AB (ref 70–99)
Glucose, Bld: 110 mg/dL — ABNORMAL HIGH (ref 70–99)
HCT: 44 % (ref 39.0–52.0)
HEMATOCRIT: 48 % (ref 39.0–52.0)
Hemoglobin: 16.3 g/dL (ref 13.0–17.0)
Hemoglobin: 15 g/dL (ref 13.0–17.0)
Potassium: 3.9 mEq/L (ref 3.7–5.3)
Potassium: 3.7 mEq/L (ref 3.7–5.3)
Sodium: 143 mEq/L (ref 137–147)
Sodium: 143 mEq/L (ref 137–147)
TCO2: 26 mmol/L (ref 0–100)
TCO2: 24 mmol/L (ref 0–100)

## 2014-04-03 MED ORDER — TETRACAINE HCL 0.5 % OP SOLN
1.0000 [drp] | Freq: Once | OPHTHALMIC | Status: AC
  Administered 2014-04-03: 1 [drp] via OPHTHALMIC
  Filled 2014-04-03: qty 2

## 2014-04-03 MED ORDER — FLUORESCEIN SODIUM 1 MG OP STRP
1.0000 | ORAL_STRIP | Freq: Once | OPHTHALMIC | Status: AC
  Administered 2014-04-03: 1 via OPHTHALMIC
  Filled 2014-04-03: qty 1

## 2014-04-03 MED ORDER — POLYMYXIN B-TRIMETHOPRIM 10000-0.1 UNIT/ML-% OP SOLN: 1.0000 [drp] | OPHTHALMIC | Status: DC

## 2014-04-03 MED ORDER — SODIUM CHLORIDE 0.9 % IV BOLUS (SEPSIS)
1000.0000 mL | Freq: Once | INTRAVENOUS | Status: AC
  Administered 2014-04-03: 1000 mL via INTRAVENOUS

## 2014-04-03 NOTE — ED Provider Notes (Signed)
CSN: [id number]     Arrival date & time 04/03/14  1740 History  This chart was scribed for non-physician practitioner, Roxy Horseman, PA-C working with Derwood Kaplan, MD by Greggory Stallion, ED scribe. This patient was seen in room TR06C/TR06C and the patient's care was started at 7:22 PM.   Chief Complaint  Patient presents with  . Joint Pain  . Eye Problem   The history is provided by the patient. No language interpreter was used.   HPI Comments: Joshua Larson is a 33 y.o. male who presents to the Emergency Department complaining of generalized arthralgias that started one week ago. Pain is the worse in his shoulders and left knee. 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 Pt is also complaining of bilateral eye pain, right worse than left, itching and watery drainage that started this morning. Denies other eye discharge. 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   Past Medical History  Diagnosis Date  . Genital herpes   . Migraines    History reviewed. No pertinent past surgical history. History reviewed. No pertinent family history. History  Substance Use Topics  . Smoking status: Current Every Day Smoker -- 0.25 packs/day    Types: Cigarettes  . Smokeless tobacco: Not on file  . Alcohol Use: No    Review of Systems  Constitutional: Negative for fever.  HENT: Negative for congestion.   Eyes: Positive for pain and itching. Negative for discharge.  Respiratory: Negative for shortness of breath.   Cardiovascular: Negative for chest pain.  Gastrointestinal: Negative for abdominal distention.  Musculoskeletal: Positive for arthralgias.  Skin: Negative for rash.  Neurological: Negative for speech difficulty.  Psychiatric/Behavioral: Negative for confusion.   Allergies  Ciprofloxacin and Keflex  Home Medications   Prior to Admission medications   Not on File   BP 133/89  Pulse 80  Temp(Src) 98.2 F (36.8 C) (Oral)   Resp 18  SpO2 97%  Physical Exam  Nursing note and vitals reviewed. Constitutional: He is oriented to person, place, and time. He appears well-developed and well-nourished. No distress.  HENT:  Head: Normocephalic and atraumatic.  Eyes: Conjunctivae and EOM are normal. Pupils are equal, round, and reactive to light. Right eye exhibits no discharge. Left eye exhibits no discharge. No scleral icterus.  Bilateral eye pH is 7  No evidence of corneal abrasion, no fluorescein uptake, no retained foreign body   Neck: Normal range of motion. Neck supple. No JVD present.  Cardiovascular: Normal rate, regular rhythm and normal heart sounds.  Exam reveals no gallop and no friction rub.   No murmur heard. Pulmonary/Chest: Effort normal and breath sounds normal. No stridor. No respiratory distress. He has no wheezes. He has no rales. He exhibits no tenderness.  Abdominal: Soft. He exhibits no distension and no mass. There is no tenderness. There is no rebound and no guarding.  Musculoskeletal: Normal range of motion. He exhibits no edema and no tenderness.  Moves all extremities, no range of motion or strength deficits, no tenderness to palpation  Neurological: He is alert and oriented to person, place, and time.  Skin: Skin is warm and dry.  Psychiatric: He has a normal mood and affect. His behavior is normal. Judgment and thought content normal.    ED Course  Procedures (including critical care time)  DIAGNOSTIC STUDIES: Oxygen Saturation is 97% on RA, normal by my interpretation.    COORDINATION OF CARE:  7:24 PM-Discussed treatment plan which includes checking eye pH and for corneal abrasions with pt at bedside and pt agreed to plan.   9:54 PM-Spoke with Dr. Rhunette CroftNanavati about pt. He suggests discharge and following up with PCP for creatinine recheck in 3-5 days.   Results for orders placed during the hospital encounter of 04/03/14  I-STAT CHEM 8, ED      Result Value Ref Range   Sodium 143   137 - 147 mEq/L   Potassium 3.9  3.7 - 5.3 mEq/L   Chloride 107  96 - 112 mEq/L   BUN 6  6 - 23 mg/dL   Creatinine, Ser 0.161.40 (*) 0.50 - 1.35 mg/dL   Glucose, Bld 010110 (*) 70 - 99 mg/dL   Calcium, Ion 9.321.30 (*) 1.12 - 1.23 mmol/L   TCO2 26  0 - 100 mmol/L   Hemoglobin 16.3  13.0 - 17.0 g/dL   HCT 35.548.0  73.239.0 - 20.252.0 %  I-STAT CHEM 8, ED      Result Value Ref Range   Sodium 143  137 - 147 mEq/L   Potassium 3.7  3.7 - 5.3 mEq/L   Chloride 106  96 - 112 mEq/L   BUN 6  6 - 23 mg/dL   Creatinine, Ser 5.421.30  0.50 - 1.35 mg/dL   Glucose, Bld 706100 (*) 70 - 99 mg/dL   Calcium, Ion 2.371.28 (*) 1.12 - 1.23 mmol/L   TCO2 24  0 - 100 mmol/L   Hemoglobin 15.0  13.0 - 17.0 g/dL   HCT 62.844.0  31.539.0 - 17.652.0 %   Dg Chest 2 View  03/06/2014   CLINICAL DATA:  Chest pain after taking unknown pills.  EXAM: CHEST  2 VIEW  COMPARISON:  Chest radiograph performed 02/04/2014  FINDINGS: The lungs are well-aerated and clear. There is no evidence of focal opacification, pleural effusion or pneumothorax.  The heart is normal in size; the mediastinal contour is within normal limits. No acute osseous abnormalities are seen.  IMPRESSION: No acute cardiopulmonary process seen.   Electronically Signed   By: Roanna RaiderJeffery  Chang M.D.   On: 03/06/2014 22:36      EKG Interpretation None      MDM   Final diagnoses:  Joint pain  Dehydration  Bilateral conjunctivitis    Patient with generalized joint pain. States that he works and walks a lot. Also believes that he is dehydrated. Creatinine is 1.4, improved to 1.3 after a liter of fluid, but will recommend recheck in 3-5 days.  I personally performed the services described in this documentation, which was scribed in my presence. The recorded information has been reviewed and is accurate.  Roxy Horsemanobert Veronika Heard, PA-C 04/03/14 2159

## 2014-04-03 NOTE — ED Notes (Signed)
Pt reports generalized joint aches x 1 week. States "I walk a lot so they have just been aching at night." Denies any numbness/tingling. Neuro intact. Pt also reports right eye itching since AM with tearing. NAD.

## 2014-04-03 NOTE — Discharge Instructions (Signed)
You need to have your kidney function re-checked in 3-5 days.  Arthralgia Your caregiver has diagnosed you as suffering from an arthralgia. Arthralgia means there is pain in a joint. This can come from many reasons including:  Bruising the joint which causes soreness (inflammation) in the joint.  Wear and tear on the joints which occur as we grow older (osteoarthritis).  Overusing the joint.  Various forms of arthritis.  Infections of the joint. Regardless of the cause of pain in your joint, most of these different pains respond to anti-inflammatory drugs and rest. The exception to this is when a joint is infected, and these cases are treated with antibiotics, if it is a bacterial infection. HOME CARE INSTRUCTIONS   Rest the injured area for as long as directed by your caregiver. Then slowly start using the joint as directed by your caregiver and as the pain allows. Crutches as directed may be useful if the ankles, knees or hips are involved. If the knee was splinted or casted, continue use and care as directed. If an stretchy or elastic wrapping bandage has been applied today, it should be removed and re-applied every 3 to 4 hours. It should not be applied tightly, but firmly enough to keep swelling down. Watch toes and feet for swelling, bluish discoloration, coldness, numbness or excessive pain. If any of these problems (symptoms) occur, remove the ace bandage and re-apply more loosely. If these symptoms persist, contact your caregiver or return to this location.  For the first 24 hours, keep the injured extremity elevated on pillows while lying down.  Apply ice for 15-20 minutes to the sore joint every couple hours while awake for the first half day. Then 03-04 times per day for the first 48 hours. Put the ice in a plastic bag and place a towel between the bag of ice and your skin.  Wear any splinting, casting, elastic bandage applications, or slings as instructed.  Only take  over-the-counter or prescription medicines for pain, discomfort, or fever as directed by your caregiver. Do not use aspirin immediately after the injury unless instructed by your physician. Aspirin can cause increased bleeding and bruising of the tissues.  If you were given crutches, continue to use them as instructed and do not resume weight bearing on the sore joint until instructed. Persistent pain and inability to use the sore joint as directed for more than 2 to 3 days are warning signs indicating that you should see a caregiver for a follow-up visit as soon as possible. Initially, a hairline fracture (break in bone) may not be evident on X-rays. Persistent pain and swelling indicate that further evaluation, non-weight bearing or use of the joint (use of crutches or slings as instructed), or further X-rays are indicated. X-rays may sometimes not show a small fracture until a week or 10 days later. Make a follow-up appointment with your own caregiver or one to whom we have referred you. A radiologist (specialist in reading X-rays) may read your X-rays. Make sure you know how you are to obtain your X-ray results. Do not assume everything is normal if you do not hear from [place]. SEEK MEDICAL CARE IF: Bruising, swelling, or pain increases. SEEK IMMEDIATE MEDICAL CARE IF:   Your fingers or toes are numb or blue.  The pain is not responding to medications and continues to stay the same or get worse.  The pain in your joint becomes severe.  You develop a fever over 102 F (38.9 C).  It  becomes impossible to move or use the joint. MAKE SURE YOU:   Understand these instructions.  Will watch your condition.  Will get help right away if you are not doing well or get worse. Document Released: 09/08/2005 Document Revised: 12/01/2011 Document Reviewed: 04/26/2008 [facility] Patient Information 2015 [place], [place]. This information is not intended to replace advice given to you by your health care  provider. Make sure you discuss any questions you have with your health care provider. Viral Conjunctivitis Conjunctivitis is an irritation (inflammation) of the clear membrane that covers the white part of the eye (the conjunctiva). The irritation can also happen on the underside of the eyelids. Conjunctivitis makes the eye red or pink in color. This is what is commonly known as pink eye. Viral conjunctivitis can spread easily (contagious). CAUSES   Infection from virus on the surface of the eye.  Infection from the irritation or injury of nearby tissues such as the eyelids or cornea.  More serious inflammation or infection on the inside of the eye.  Other eye diseases.  The use of certain eye medications. SYMPTOMS  The normally white color of the eye or the underside of the eyelid is usually pink or red in color. The pink eye is usually associated with irritation, tearing and some sensitivity to light. Viral conjunctivitis is often associated with a clear, watery discharge. If a discharge is present, there may also be some blurred vision in the affected eye. DIAGNOSIS  Conjunctivitis is diagnosed by an eye exam. The eye specialist looks for changes in the surface tissues of the eye which take on changes characteristic of the specific types of conjunctivitis. A sample of any discharge may be collected on a Q-Tip (sterile swap). The sample will be sent to a lab to see whether or not the inflammation is caused by bacterial or viral infection. TREATMENT  Viral conjunctivitis will not respond to medicines that kill germs (antibiotics). Treatment is aimed at stopping a bacterial infection on top of the viral infection. The goal of treatment is to relieve symptoms (such as itching) with antihistamine drops or other eye medications.  HOME CARE INSTRUCTIONS   To ease discomfort, apply a cool, clean wash cloth to your eye for 10 to 20 minutes, 3 to 4 times a day.  Gently wipe away any drainage from  the eye with a warm, wet washcloth or a cotton ball.  Wash your hands often with soap and use paper towels to dry.  Do not share towels or washcloths. This may spread the infection.  Change or wash your pillowcase every day.  You should not use eye make-up until the infection is gone.  Stop using contacts lenses. Ask your eye professional how to sterilize or replace them before using again. This depends on the type of contact lenses used.  Do not touch the edge of the eyelid with the eye drop bottle or ointment tube when applying medications to the affected eye. This will stop you from spreading the infection to the other eye or to others. SEEK IMMEDIATE MEDICAL CARE IF:   The infection has not improved within 3 days of beginning treatment.  A watery discharge from the eye develops.  Pain in the eye increases.  The redness is spreading.  Vision becomes blurred.  An oral temperature above 102 F (38.9 C) develops, or as your caregiver suggests.  Facial pain, redness or swelling develops.  Any problems that may be related to the prescribed medicine develop. MAKE SURE YOU:  Understand these instructions.  Will watch your condition.  Will get help right away if you are not doing well or get worse. Document Released: 09/08/2005 Document Revised: 12/01/2011 Document Reviewed: 04/27/2008 [facility] Patient Information 2015 [place]. This information is not intended to replace advice given to you by your health care provider. Make sure you discuss any questions you have with your health care provider.

## 2014-04-04 NOTE — ED Provider Notes (Signed)
Medical screening examination/treatment/procedure(s) were performed by non-physician practitioner and as supervising physician I was immediately available for consultation/collaboration.   EKG Interpretation None       Derwood KaplanAnkit Shaylan Tutton, MD 04/04/14 0127

## 2014-04-10 ENCOUNTER — Emergency Department (HOSPITAL_COMMUNITY)
Admission: EM | Admit: 2014-04-10 | Discharge: 2014-04-11 | Disposition: A | Discharge status: Home / Self Care | Payer: Self-pay | Attending: Emergency Medicine | Admitting: Emergency Medicine

## 2014-04-10 ENCOUNTER — Encounter (HOSPITAL_COMMUNITY): Payer: Self-pay | Admitting: Emergency Medicine

## 2014-04-10 DIAGNOSIS — Z8619 Personal history of other infectious and parasitic diseases: Secondary | ICD-10-CM | POA: Insufficient documentation

## 2014-04-10 DIAGNOSIS — Z008 Encounter for other general examination: Secondary | ICD-10-CM | POA: Insufficient documentation

## 2014-04-10 DIAGNOSIS — F17200 Nicotine dependence, unspecified, uncomplicated: Secondary | ICD-10-CM | POA: Insufficient documentation

## 2014-04-10 DIAGNOSIS — Z0000 Encounter for general adult medical examination without abnormal findings: Secondary | ICD-10-CM

## 2014-04-10 DIAGNOSIS — Z792 Long term (current) use of antibiotics: Secondary | ICD-10-CM | POA: Insufficient documentation

## 2014-04-10 DIAGNOSIS — Z8679 Personal history of other diseases of the circulatory system: Secondary | ICD-10-CM | POA: Insufficient documentation

## 2014-04-10 LAB — COMPREHENSIVE METABOLIC PANEL
ALK PHOS: 5 U/L — AB (ref 39–117)
ALT: 50 U/L (ref 0–53)
AST: 42 U/L — ABNORMAL HIGH (ref 0–37)
Albumin: 4.4 g/dL (ref 3.5–5.2)
Albumin: 4.7 g/dL (ref 3.5–5.2)
Alkaline Phosphatase: 62 U/L (ref 39–117)
Anion gap: 26 — ABNORMAL HIGH (ref 5–15)
Anion gap: 12 (ref 5–15)
BUN: 12 mg/dL (ref 6–23)
BUN: 12 mg/dL (ref 6–23)
CALCIUM: 10.1 mg/dL (ref 8.4–10.5)
CHLORIDE: 95 meq/L — AB (ref 96–112)
CO2: 18 meq/L — AB (ref 19–32)
CO2: 24 mEq/L (ref 19–32)
CREATININE: 0.94 mg/dL (ref 0.50–1.35)
Chloride: 104 mEq/L (ref 96–112)
Creatinine, Ser: 0.85 mg/dL (ref 0.50–1.35)
GFR calc Af Amer: >90 mL/min (ref >90)
GFR calc non Af Amer: >90 mL/min (ref >90)
Glucose, Bld: 116 mg/dL — ABNORMAL HIGH (ref 70–99)
Glucose, Bld: 102 mg/dL — ABNORMAL HIGH (ref 70–99)
Potassium: 4 mEq/L (ref 3.7–5.3)
Sodium: 139 mEq/L (ref 137–147)
Sodium: 140 mEq/L (ref 137–147)
Total Bilirubin: 0.3 mg/dL (ref 0.3–1.2)
Total Protein: 7.6 g/dL (ref 6.0–8.3)
Total Protein: 7.9 g/dL (ref 6.0–8.3)

## 2014-04-10 NOTE — Discharge Instructions (Signed)
Avoid alcohol or frequent tylenol. Follow up with primary care doctor.    Emergency Department Resource Guide 1) Find a Doctor and Pay Out of Pocket Although you won't have to find out who is covered by your insurance plan, it is a good idea to ask around and get recommendations. You will then need to call the office and see if the doctor you have chosen will accept you as a new patient and what types of options they offer for patients who are self-pay. Some doctors offer discounts or will set up payment plans for their patients who do not have insurance, but you will need to ask so you aren't surprised when you get to your appointment.  2) Contact Your Local Health Department Not all health departments have doctors that can see patients for sick visits, but many do, so it is worth a call to see if yours does. If you don't know where your local health department is, you can check in your phone book. The CDC also has a tool to help you locate your state's health department, and many state websites also have listings of all of their local health departments.  3) Find a Walk-in Clinic If your illness is not likely to be very severe or complicated, you may want to try a walk in clinic. These are popping up all over the country in pharmacies, drugstores, and shopping centers. They're usually staffed by nurse practitioners or physician assistants that have been trained to treat common illnesses and complaints. They're usually fairly quick and inexpensive. However, if you have serious medical issues or chronic medical problems, these are probably not your best option.  No Primary Care Doctor: - Call Health Connect at  [phone] - they can help you locate a primary care doctor that  accepts your insurance, provides certain services, etc. - Physician Referral Service- [phone]  Chronic Pain Problems: Organization         Address  Phone   Notes  [facility]  [phone] Patients  need to be referred by their primary care doctor.   Medication Assistance: Organization         Address  Phone   Notes  [facility] [address] [place], [place] [address] [phone]) [phone] --Must be a [place] -- Must have NO insurance coverage whatsoever (no Medicaid/ Medicare, etc.) -- The pt. MUST have a primary care doctor that directs their care regularly and follows them in the community   MedAssist  [phone]) [phone]   [employer]  [phone]    Agencies that provide inexpensive medical care: Organization         Address  Phone   Notes  [facility]  [phone]   [facility]    [phone]   [facility] [address] [place], [place] [address] [phone]   [facility] [address], [place] [phone]   [facility]anned Parenthood    [phone]   [facility]    [phone]   [facility]  [address], [place] Phone:  [phone]) [phone], Fax:  [phone] Hours of Operation:  9 am - 6 pm, M-F.  Also accepts Medicaid/Medicare and self-pay.  [facility]  [address], [place] Phone: [phone](336) [phone], Fax: [phone]. Hours of Operation:  8:30 am -  5:30 pm, M-F.  Also accepts Medicaid and self-pay.  °[facility] [place] [address], [place] Phone: [phone]   °[facility] [address], [place], [place] [phone], Ext. 123 Mondays & Thursdays: 7-9 AM.  First 15 patients are seen on a first come, first serve basis. °  ° °Medicaid-accepting [place] Providers: ° °Organization         Address  Phone   Notes  °[facility] [address], [place] [phone] Also accepts self-pay patients.  °[facility] [address], [place] ° [phone]     °[facility] [address], [place] [phone]   °[facility] [address] [place] [address], [place] [phone]   °Veita Bland [address], [place]  ° [phone] Only accepts [place] Access Medicaid patients after they have their name applied to their card.  ° °Self-Pay (no insurance) in [place]: ° °Organization         Address  Phone   Notes  °Sickle Cell Patients, [facility] [address], [place] [phone]   °[facility] [address], [place] [phone]) [phone]   °[facility] [place] ° [address], [place] [phone]) [phone]   °[facility]/Dr. Osei-Bonsu ° [address] [place] [address], [place] or [address], [place] [phone] Phone number for both [place] and [place] locations is the same.  °[facility] [address], [place] [phone]   °[facility] [address] [place] [address], [place] or [address] [phone]) [phone] °([phone]) [phone]   °[facility] [address], [place] [phone]) [phone], phone; [phone], fax Sees patients 1st and 3rd Saturday of every month.  Must not qualify for public or private insurance (i.e. Medicaid, Medicare, [place] Health Choice, Veterans' Benefits) • Household income should be no more than 200% of the poverty level •The clinic cannot treat you if you are pregnant or think you are pregnant • Sexually transmitted diseases are not treated at the clinic.  ° ° °Dental Care: °Organization         Address  Phone  Notes  °[place] [facility] [address], [place] [phone]) [phone] Accepts children up to age 21 who are enrolled in Medicaid or [place] Health Choice; pregnant women with a Medicaid card; and children who have applied for Medicaid or [place] Health Choice, but were declined,  whose parents can pay a reduced fee at time of service.  °[place] [facility] [place]  [address], [place] [phone]) [phone] Accepts children up to age 21 who are enrolled in Medicaid or [place] Health Choice; pregnant women with a Medicaid card; and children who have applied for Medicaid or [place] Health Choice, but were declined, whose parents can pay a reduced fee at time of service.  °[facility] ° [address], [place] [phone]) [phone] Patients are seen by appointment only. Walk-ins are not accepted. [facility] will see patients 18 years of age and older. °Monday - Tuesday (8am-5pm) °Most Wednesdays (8:30-5pm) °$30 per visit, cash only  °[facility] ° [address], [place] [phone]) [phone] Patients are seen by appointment only. Walk-ins   are not accepted. [place] will see patients 74 years of age and older. One Wednesday Evening (Monthly: Volunteer Based).  $30 per visit, cash only  [place]  [phone] for adults; Children under age 46, call Graduate Pediatric Dentistry at [phone]. Children aged 83-14, please call [phone] to request a pediatric application.  Dental services are provided in all areas of dental care including fillings, crowns and bridges, complete and partial dentures, implants, gum treatment, root canals, and extractions. Preventive care is also provided. Treatment is provided to both adults and children. Patients are selected via a lottery and there is often a waiting list.   [facility] [place] [address], [place]  [phone] [email or web address]   [facility] [address] [place], [place] [phone], Ext. [phone] Second and Fourth Thursday of each month, opens at 6:30 AM; Clinic ends at 9 AM.  Patients are seen on a first-come first-served basis, and a limited number are seen during each clinic.   [facility]  [address] Hillard Danker [place], [place] [phone]   Eligibility Requirements You must have lived in [place], [place], or [place] counties for at least the last three months.   You cannot be eligible for state or federal sponsored [employer], including [employer], [place], or [employer].   You generally cannot be eligible for healthcare insurance through your employer.    How to apply: Eligibility screenings are held every Tuesday and Wednesday afternoon from 1:00 pm until 4:00 pm. You do not need an appointment for the interview!  [facility] [address] [place], [place]   Portal  [place] Department  [place]  [phone]    Behavioral Health Resources in the Community: Intensive Outpatient Programs Organization         Address  Phone  Notes  [place]. [address] [place], [place] [phone]   [facility] [address], [place], [place]   ADS: [facility] [address] [place], [place]   [place] [address],  [place], [place] or [phone]   Substance Abuse Resources Organization         Address  Phone  Notes  [facility]  [phone]) [phone]   [place]  [phone]   [facility] [place]   Chinita Pester  [phone]   Residential & Outpatient Substance Abuse Program  [phone]   Psychological Services Organization         Address  Phone  Notes  [facility]  [place]  [phone]   [place] [address] [place] or [phone]    Mobile Crisis Teams Organization         Address  Phone  Notes  [facility], Mobile Crisis Care Unit   [phone]   [facility]  [address] [place], [place]   [place] [address] [place] [phone]    Self-Help/Support Groups Organization         Address  Phone             Notes  [place] [place] - variety of support groups  [place] Call for more information  Narcotics Anonymous (  NA), [facility] [address], [employer] [place]  2 meetings at this location   Residential Facilities manager         Address  Phone  Notes  [facility] [place],    [place]  [phone]-(501)[phone]   [facility]  [address] [place] [phone], [place], [place]   [place], [place] [phone] Admissions: 8am-3pm M-F  Incentives Substance Walker [address]    [place], [place] [phone]   [facility] [address] [place], [place], [place]   [facility] [address],  [place], [place]   [facility] - Intensive Outpatient [address], [place], [place]   [facility]  [place], [place] [phone] or [phone]   [facility] ([facility]) [address], [place], [place] Accepts Medicaid  [facility] [address],  [place] [phone] Substance Abuse/Addiction Treatment   [facility] Organization         Address  Phone  Notes  [facility]  [phone]   Domenic Schwab, PhD [address] [place]lis [place], [place]   [phone] or [phone]   [place], [place] [phone]   [facility] [address], [place], [place] [phone] Insurance/Medicaid/sponsorship through [facility] and Families [address]                                     [place], [place] [phone] [place] [address], [place] [phone]    Dr. Adele Schilder  [phone]   [facility] Dept. 1) [address], [place] 2) [place] 3)  [place] [address], [place] [phone]  [phone]   [place] [phone] or [phone] (After Hours)

## 2014-04-10 NOTE — ED Notes (Signed)
Pt sts here for recheck of liver enzymes; pt sts was elevated last time he was here and [facility]

## 2014-04-11 NOTE — ED Provider Notes (Signed)
CSN: [id number]     Arrival date & time 04/10/14  1856 History   First MD Initiated Contact with Patient 04/10/14 2250     Chief Complaint  Patient presents with  . Abnormal Lab     (Consider location/radiation/quality/duration/timing/severity/associated sxs/prior Treatment) HPI Joshua Larson is a 33 y.o. male who presents emergency department for recheck of his liver function tests. Patient was seen here 8 days ago for I. pain in joint pain. He had blood work done and it showed that his liver function was just mildly elevated. He was told to followup with his primary care Dr. in one week to have his liver function tests recheck. Patient states he does not have a primary care Dr. and he is here to have those rechecked. He denies any complaints at this time. Specifically no fever, chills, weakness, abdominal pain, discoloration of the skin, nausea, vomiting. He is currently not taking any medications.  Past Medical History  Diagnosis Date  . Genital herpes   . Migraines    History reviewed. No pertinent past surgical history. History reviewed. No pertinent family history. History  Substance Use Topics  . Smoking status: Current Every Day Smoker -- 0.25 packs/day    Types: Cigarettes  . Smokeless tobacco: Not on file  . Alcohol Use: No    Review of Systems  Constitutional: Negative for fever and chills.  Respiratory: Negative for cough, chest tightness and shortness of breath.   Cardiovascular: Negative for chest pain, palpitations and leg swelling.  Gastrointestinal: Negative for nausea, vomiting, abdominal pain, diarrhea and abdominal distention.  Genitourinary: Negative for dysuria, urgency, frequency and hematuria.  Musculoskeletal: Negative for arthralgias, myalgias, neck pain and neck stiffness.  Skin: Negative for rash.  Allergic/Immunologic: Negative for immunocompromised state.  Neurological: Negative for dizziness, weakness, light-headedness, numbness and headaches.  All  other systems reviewed and are negative.     Allergies  Ciprofloxacin and Keflex  Home Medications   Prior to Admission medications   Medication Sig Start Date End Date Taking? Authorizing Provider  trimethoprim-polymyxin b (POLYTRIM) ophthalmic solution Place 1 drop into both eyes every 4 (four) hours. 04/03/14  Yes Roxy Horseman, PA-C   BP 130/93  Pulse 80  Temp(Src) 98.5 F (36.9 C) (Oral)  Resp 18  SpO2 97% Physical Exam  Nursing note and vitals reviewed. Constitutional: He appears well-developed and well-nourished. No distress.  HENT:  Head: Normocephalic and atraumatic.  Eyes: Conjunctivae are normal. No scleral icterus.  Neck: Neck supple.  Cardiovascular: Normal rate, regular rhythm and normal heart sounds.   Pulmonary/Chest: Effort normal. No respiratory distress. He has no wheezes. He has no rales.  Abdominal: Soft. Bowel sounds are normal. He exhibits no distension. There is no tenderness. There is no rebound.  Musculoskeletal: He exhibits no edema.  Neurological: He is alert.  Skin: Skin is warm and dry.  No jaundice    ED Course  Procedures (including critical care time) Labs Review Labs Reviewed  COMPREHENSIVE METABOLIC PANEL - Abnormal; Notable for the following:    Chloride 95 (*)    CO2 18 (*)    Glucose, Bld 116 (*)    Alkaline Phosphatase 5 (*)    Total Bilirubin <0.2 (*)    Anion gap 26 (*)    All other components within normal limits  COMPREHENSIVE METABOLIC PANEL - Abnormal; Notable for the following:    Glucose, Bld 102 (*)    AST 42 (*)    All other components within normal limits  Imaging Review No results found.   EKG Interpretation None      MDM   Final diagnoses:  Normal physical exam    Looking through records, patient's LFTs 8 days ago were just minimally abnormal. He is AST was 45, ALT was 43. I would not consider this to be abnormal tests. Today the repeat test are similar. Given he has no complaints he will need  further followup with primary care Dr. as needed. Vital signs are normal, stable for discharge  Filed Vitals:   04/10/14 1904 04/10/14 2307 04/10/14 2308 04/10/14 2345  BP: 139/93 133/86  130/93  Pulse: 103  84 80  Temp: 98.5 F (36.9 C)     TempSrc: Oral     Resp: 18     SpO2: 98%  98% 97%       Lottie Musselatyana A Gloriajean Okun, PA-C 04/11/14 0058

## 2014-04-12 NOTE — ED Provider Notes (Signed)
Medical screening examination/treatment/procedure(s) were performed by non-physician practitioner and as supervising physician I was immediately available for consultation/collaboration.    Kirk Sampley D Milana Salay, MD 04/12/14 0045 

## 2014-05-16 ENCOUNTER — Emergency Department (HOSPITAL_COMMUNITY)
Admission: EM | Admit: 2014-05-16 | Discharge: 2014-05-16 | Disposition: A | Discharge status: Home / Self Care | Payer: Self-pay | Attending: Emergency Medicine | Admitting: Emergency Medicine

## 2014-05-16 ENCOUNTER — Emergency Department (HOSPITAL_COMMUNITY): Payer: Self-pay

## 2014-05-16 ENCOUNTER — Encounter (HOSPITAL_COMMUNITY): Payer: Self-pay | Admitting: Emergency Medicine

## 2014-05-16 DIAGNOSIS — N509 Disorder of male genital organs, unspecified: Secondary | ICD-10-CM | POA: Insufficient documentation

## 2014-05-16 DIAGNOSIS — N433 Hydrocele, unspecified: Secondary | ICD-10-CM

## 2014-05-16 DIAGNOSIS — N432 Other hydrocele: Secondary | ICD-10-CM | POA: Insufficient documentation

## 2014-05-16 DIAGNOSIS — N503 Cyst of epididymis: Secondary | ICD-10-CM

## 2014-05-16 DIAGNOSIS — Z8679 Personal history of other diseases of the circulatory system: Secondary | ICD-10-CM | POA: Insufficient documentation

## 2014-05-16 DIAGNOSIS — Z8619 Personal history of other infectious and parasitic diseases: Secondary | ICD-10-CM | POA: Insufficient documentation

## 2014-05-16 DIAGNOSIS — N50819 Testicular pain, unspecified: Secondary | ICD-10-CM

## 2014-05-16 DIAGNOSIS — F17200 Nicotine dependence, unspecified, uncomplicated: Secondary | ICD-10-CM | POA: Insufficient documentation

## 2014-05-16 DIAGNOSIS — N508 Other specified disorders of male genital organs: Secondary | ICD-10-CM | POA: Insufficient documentation

## 2014-05-16 LAB — BASIC METABOLIC PANEL
Anion gap: 12 (ref 5–15)
BUN: 10 mg/dL (ref 6–23)
CO2: 24 meq/L (ref 19–32)
Calcium: 9.9 mg/dL (ref 8.4–10.5)
Chloride: 104 mEq/L (ref 96–112)
Creatinine, Ser: 1.07 mg/dL (ref 0.50–1.35)
GFR calc Af Amer: >90 mL/min (ref >90)
GFR, EST NON AFRICAN AMERICAN: 89 mL/min — AB (ref >90)
GLUCOSE: 100 mg/dL — AB (ref 70–99)
Potassium: 4.6 mEq/L (ref 3.7–5.3)
SODIUM: 140 meq/L (ref 137–147)

## 2014-05-16 LAB — CBC WITH DIFFERENTIAL/PLATELET
Basophils Absolute: 0.1 10*3/uL (ref 0.0–0.1)
Basophils Relative: 1 % (ref 0–1)
EOS ABS: 0.2 10*3/uL (ref 0.0–0.7)
Eosinophils Relative: 3 % (ref 0–5)
HCT: 49 % (ref 39.0–52.0)
HEMOGLOBIN: 16.4 g/dL (ref 13.0–17.0)
LYMPHS PCT: 29 % (ref 12–46)
Lymphs Abs: 1.8 10*3/uL (ref 0.7–4.0)
MCH: 29 pg (ref 26.0–34.0)
MCHC: 33.5 g/dL (ref 30.0–36.0)
MCV: 86.6 fL (ref 78.0–100.0)
MONOS PCT: 7 % (ref 3–12)
Monocytes Absolute: 0.5 10*3/uL (ref 0.1–1.0)
NEUTROS PCT: 60 % (ref 43–77)
Neutro Abs: 3.9 10*3/uL (ref 1.7–7.7)
PLATELETS: 183 10*3/uL (ref 150–400)
RBC: 5.66 MIL/uL (ref 4.22–5.81)
RDW: 12.9 % (ref 11.5–15.5)
WBC: 6.4 10*3/uL (ref 4.0–10.5)

## 2014-05-16 LAB — URINALYSIS, ROUTINE W REFLEX MICROSCOPIC
Bilirubin Urine: NEGATIVE
GLUCOSE, UA: NEGATIVE mg/dL
HGB URINE DIPSTICK: NEGATIVE
Ketones, ur: NEGATIVE mg/dL
LEUKOCYTES UA: NEGATIVE
Nitrite: NEGATIVE
PH: 5.5 (ref 5.0–8.0)
Protein, ur: NEGATIVE mg/dL
Specific Gravity, Urine: 1.023 (ref 1.005–1.030)
Urobilinogen, UA: 0.2 mg/dL (ref 0.0–1.0)

## 2014-05-16 MED ORDER — OXYCODONE-ACETAMINOPHEN 5-325 MG PO TABS: 1.0000 | ORAL_TABLET | Freq: Four times a day (QID) | ORAL | Status: DC | PRN

## 2014-05-16 MED ORDER — OXYCODONE-ACETAMINOPHEN 5-325 MG PO TABS
1.0000 | ORAL_TABLET | Freq: Once | ORAL | Status: AC
  Administered 2014-05-16: 1 via ORAL
  Filled 2014-05-16: qty 1

## 2014-05-16 MED ORDER — NAPROXEN 500 MG PO TABS: 500.0000 mg | ORAL_TABLET | Freq: Two times a day (BID) | ORAL | Status: DC | PRN

## 2014-05-16 NOTE — ED Notes (Signed)
US at bedside

## 2014-05-16 NOTE — ED Notes (Signed)
Chaperoned testicular exam with Tawanna Cooler, NT and [place], [place]

## 2014-05-16 NOTE — ED Provider Notes (Signed)
CSN: [id number]     Arrival date & time 05/16/14  1016 History   First MD Initiated Contact with Patient 05/16/14 1240     Chief Complaint  Patient presents with  . Testicle Pain     (Consider location/radiation/quality/duration/timing/severity/associated sxs/prior Treatment) HPI Comments: Joshua Larson is a 33 y.o. male with a PMHx of genital herpes, migraines, and testicular cysts, who presents to the ED today with complaints of chronic, ongoing, unchanged testicular "knots" which are tender, which first appeared approx 1 yr ago. States the pain is achy, mild, constant, nonradiating, improved with ejaculation, with no known aggravating factors. States he had an U/S previously which showed a cyst. He is concerned because the knots are still present and have not gone away, and he was advised to have a recheck to ensure it wasn't cancerous. Pt denies skin changes, scrotal swelling, fevers, chills, CP, SOB, abd pain, n/v/d/c, rectal pain, hematochezia, melena, penile discharge or swelling, LAD, rashes, myalgias or arthralgias. Denies urinary symptoms or dribbling. Has not been sexually active in 10 yrs since he's been incarcerated.   Patient is a 33 y.o. male presenting with testicular pain. The history is provided by the patient. No language interpreter was used.  Testicle Pain This is a recurrent problem. The current episode started more than 1 year ago. The problem occurs constantly. The problem has been unchanged. Pertinent negatives include no abdominal pain, arthralgias, change in bowel habit, chest pain, chills, diaphoresis, fatigue, fever, headaches, joint swelling, myalgias, nausea, neck pain, numbness, rash, swollen glands, urinary symptoms, vertigo, vomiting or weakness. Nothing aggravates the symptoms. He has tried nothing for the symptoms. The treatment provided no relief.    Past Medical History  Diagnosis Date  . Genital herpes   . Migraines    History reviewed. No pertinent past  surgical history. No family history on file. History  Substance Use Topics  . Smoking status: Current Every Day Smoker -- 0.25 packs/day    Types: Cigarettes  . Smokeless tobacco: Not on file  . Alcohol Use: No    Review of Systems  Constitutional: Negative for fever, chills, diaphoresis and fatigue.  Eyes: Negative for pain and redness.  Respiratory: Negative for shortness of breath.   Cardiovascular: Negative for chest pain.  Gastrointestinal: Negative for nausea, vomiting, abdominal pain, diarrhea, constipation, blood in stool, rectal pain and change in bowel habit.  Genitourinary: Positive for testicular pain. Negative for dysuria, urgency, frequency, hematuria, flank pain, decreased urine volume, discharge, penile swelling, scrotal swelling, difficulty urinating, genital sores and penile pain.  Musculoskeletal: Negative for arthralgias, back pain, joint swelling, myalgias and neck pain.  Skin: Negative for rash.  Neurological: Negative for dizziness, vertigo, weakness, light-headedness, numbness and headaches.  Hematological: Negative for adenopathy.  10 Systems reviewed and are negative for acute change except as noted in the HPI.     Allergies  Ciprofloxacin and Keflex  Home Medications   Prior to Admission medications   Medication Sig Start Date End Date Taking? Authorizing Provider  trimethoprim-polymyxin b (POLYTRIM) ophthalmic solution Place 1 drop into both eyes every 4 (four) hours. 04/03/14  Yes Roxy Horseman, PA-C  naproxen (NAPROSYN) 500 MG tablet Take 1 tablet (500 mg total) by mouth 2 (two) times daily as needed for mild pain, moderate pain or headache (TAKE WITH MEALS.). 05/16/14   Conda Wannamaker Strupp Camprubi-Soms, PA-C  oxyCODONE-acetaminophen (PERCOCET) 5-325 MG per tablet Take 1-2 tablets by mouth every 6 (six) hours as needed for severe pain. 05/16/14   Donnita Falls  Camprubi-Soms, PA-C   BP 124/94  Pulse 65  Temp(Src) 98 F (36.7 C) (Oral)  Resp 20   SpO2 99% Physical Exam  Nursing note and vitals reviewed. Constitutional: He is oriented to person, place, and time. Vital signs are normal. He appears well-developed and well-nourished. No distress.  VSS, NAD, afebrile  HENT:  Head: Normocephalic and atraumatic.  Mouth/Throat: Mucous membranes are normal.  Eyes: Conjunctivae and EOM are normal. Right eye exhibits no discharge. Left eye exhibits no discharge.  Neck: Normal range of motion. Neck supple.  Cardiovascular: Normal rate, regular rhythm, normal heart sounds and intact distal pulses.  Exam reveals no gallop and no friction rub.   No murmur heard. Pulmonary/Chest: Effort normal and breath sounds normal. No respiratory distress. He has no wheezes. He has no rhonchi. He has no rales.  Abdominal: Soft. Normal appearance and bowel sounds are normal. He exhibits no distension. There is no tenderness. There is no rigidity, no rebound, no guarding, no CVA tenderness, no tenderness at McBurney's point and negative Murphy's sign. Hernia confirmed negative in the right inguinal area and confirmed negative in the left inguinal area.  Genitourinary: Penis normal. Cremasteric reflex is present. Right testis shows mass and tenderness. Right testis shows no swelling. Left testis shows mass and tenderness. Left testis shows no swelling. Circumcised. No phimosis, paraphimosis, hypospadias, penile erythema or penile tenderness. No discharge found.  Circumcised male without penile tenderness, erythema, discharge, phimosis, paraphimosis, or hypospadias. +Cremasteric reflex bilaterally. R testicle with several small, ~2-65mm mobile masses which are mildly tender, located posteriorly along epididymis. L testicle with one small mass, also tender, also located posteriorly. No scrotal swelling. Neg prehn's sign bilaterally. No inguinal hernia or LAD bilaterally.  Musculoskeletal: Normal range of motion.  Lymphadenopathy:       Right: No inguinal adenopathy present.        Left: No inguinal adenopathy present.  Neurological: He is alert and oriented to person, place, and time.  Skin: Skin is warm, dry and intact. No rash noted.  Psychiatric: He has a normal mood and affect.    ED Course  Procedures (including critical care time) Labs Review Labs Reviewed  BASIC METABOLIC PANEL - Abnormal; Notable for the following:    Glucose, Bld 100 (*)    GFR calc non Af Amer 89 (*)    All other components within normal limits  GC/CHLAMYDIA PROBE AMP  CBC WITH DIFFERENTIAL  URINALYSIS, ROUTINE W REFLEX MICROSCOPIC    Imaging Review US Scrotum  05/16/2014   CLINICAL DATA:  Patient feels like there are knots in his testicles.  EXAM: SCROTAL ULTRASOUND  DOPPLER ULTRASOUND OF THE TESTICLES  TECHNIQUE: Complete ultrasound examination of the testicles, epididymis, and other scrotal structures was performed. Color and spectral Doppler ultrasound were also utilized to evaluate blood flow to the testicles.  COMPARISON:  07/31/2012  FINDINGS: Right testicle  Measurements: 4.3 cm x 1.9 cm x 2.5 cm. No mass. Few small calcifications. Normal overall echogenicity.  Left testicle  Measurements: 4.0 cm x 1.7 cm x 2.4 cm. No mass. Few small calcifications. Normal overall echogenicity.  Right epididymis: 3 mm cyst. This is stable from the prior study. Otherwise unremarkable.  Left epididymis:  Normal in size and appearance.  Hydrocele:  Small left hydrocele.  Varicocele:  None visualized.  Pulsed Doppler interrogation of both testes demonstrates low resistance arterial and venous waveforms bilaterally.  IMPRESSION: 1. No testicular mass. 2. No acute finding.  No evidence of epididymitis/orchitis. 3. 3 mm  stable right epididymal cyst. 4. Several testicular calcifications, also stable. 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 Monthly testicular self-examination and annual physical exams are considered appropriate surveillance. If patient has other risk factors for testicular carcinoma, then referral to Urology should be considered. (Reference: DeCastro, et al.: A 5-Year Follow up Study of Asymptomatic Men with Testicular Microlithiasis. J Urol 2008; 179:1420-1423.) 5. Small left hydrocele, also stable.   Electronically Signed   By: Amie Portland M.D.   On: 05/16/2014 15:01   [place] Art/ven Flow Abd Pelv Doppler  05/16/2014   CLINICAL DATA:  Patient feels like there are knots in his testicles.  EXAM: SCROTAL ULTRASOUND  DOPPLER ULTRASOUND OF THE TESTICLES  TECHNIQUE: Complete ultrasound examination of the testicles, epididymis, and other scrotal structures was performed. Color and spectral Doppler ultrasound were also utilized to evaluate blood flow to the testicles.  COMPARISON:  07/31/2012  FINDINGS: Right testicle  Measurements: 4.3 cm x 1.9 cm x 2.5 cm. No mass. Few small calcifications. Normal overall echogenicity.  Left testicle  Measurements: 4.0 cm x 1.7 cm x 2.4 cm. No mass. Few small calcifications. Normal overall echogenicity.  Right epididymis: 3 mm cyst. This is stable from the prior study. Otherwise unremarkable.  Left epididymis:  Normal in size and appearance.  Hydrocele:  Small left hydrocele.  Varicocele:  None visualized.  Pulsed Doppler interrogation of both testes demonstrates low resistance arterial and venous waveforms bilaterally.  IMPRESSION: 1. No testicular mass. 2. No acute finding.  No evidence of epididymitis/orchitis. 3. 3 mm stable right epididymal cyst. 4. Several testicular calcifications, also stable. Current literature suggests that testicular microlithiasis is not a significant independent risk factor for development of testicular carcinoma, and that follow up imaging is not warranted in the absence of other risk factors. Monthly testicular self-examination and annual physical exams  are considered appropriate surveillance. If patient has other risk factors for testicular carcinoma, then referral to Urology should be considered. (Reference: DeCastro, et al.: A 5-Year Follow up Study of Asymptomatic Men with Testicular Microlithiasis. J Urol 2008; 179:1420-1423.) 5. Small left hydrocele, also stable.   Electronically Signed   By: Amie Portland M.D.   On: 05/16/2014 15:01     EKG Interpretation None      MDM   Final diagnoses:  Epididymal cyst  Hydrocele, left  Persistent testicular pain    32y/o male with testicular pain x1 yr. Prior U/S revealed microcalcifications in 07/2012. Low clinical suspicion for torsion. Given that the last U/S recommended f/up, and exam concerning for epididymitis, will obtain U/S and basic labs along with U/A and GC/CT off the U/A. No s/sx of STI, will not empirically tx. Will give percocet and reassess after U/S. DDx includes spermatocele, hydrocele, calcifications, epididymitis, or other scrotal/testicular etiology.  3:45 PM Pain improved with percocet. U/A neg, CBC WNL, BMP WNL. Scrotal U/S reveals stable R epididymal cyst, stable testicular microcalcifications, stable L hydrocele, and no s/sx of torsion, testicular mass, or epididymitis/orchitis. Discussed finding a PCP and f/up in 100yrs, as well as monthly testicular checks. Discussed that if these change, will need to see PCP to have bx done to r/o cancer, which is pt's biggest concern. Rx for small supply of percocet, and naprosyn given. I explained the diagnosis and have given explicit precautions to return to the ER including for any other  new or worsening symptoms. The patient understands and accepts the medical plan as it's been dictated and I have answered their questions. Discharge instructions concerning home care and prescriptions have been given. The patient is STABLE and is discharged to home in good condition.  BP 124/94  Pulse 65  Temp(Src) 98 F (36.7 C) (Oral)  Resp 20   SpO2 99%  Meds ordered this encounter  Medications  . oxyCODONE-acetaminophen (PERCOCET/ROXICET) 5-325 MG per tablet 1 tablet    Sig:   . oxyCODONE-acetaminophen (PERCOCET) 5-325 MG per tablet    Sig: Take 1-2 tablets by mouth every 6 (six) hours as needed for severe pain.    Dispense:  6 tablet    Refill:  0    Order Specific Question:  Supervising Provider    Answer:Eber HongBRIAN D [[id number]]  . naproxen (NAPROSYN) 500 MG tablet    Sig: Take 1 tablet (500 mg total) by mouth 2 (two) times daily as needed for mild pain, moderate pain or headache (TAKE WITH MEALS.).    Dispense:  20 tablet    Refill:  0    Order Specific Question:  Supervising Provider    Answer:  Vida Roller [address] Camprubi-Soms, PA-C 05/16/14 [phone]

## 2014-05-16 NOTE — Discharge Instructions (Signed)
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 Use naprosyn and percocet for pain, as needed, as directed for pain. Do not drive while taking this medication. Find a regular doctor to have consistent follow up of your testicular pain, and perform monthly testicular exams to detect any changes in the areas that hurt. Return to the ER for any changes or worsening symptoms, including fever, rash, penile discharge, or worsened pain in one testicle.   Testicular Self-Exam A self-exam of your testicles is looking at and feeling your testicles for abnormal lumps or swelling. Several things can cause swelling, lumps, or pain in your testicles. Some of these causes are:  Injuries.  Puffiness, redness, and soreness (inflammation).  Infection.  Extra fluids around your testicle.  Twisted testicles.  Testicular cancer. The testicles are easiest to check after warm baths or showers. They are harder to examine when you are cold.  Follow these steps while you are standing:  Hold your penis away from your body.  Roll one testicle between your thumb and finger. Feel the entire testicle.  Roll the other testicle between your thumb and finger. Feel the entire testicle. Feel for lumps, swelling, or discomfort. A normal testicle is egg shaped. It feels firm. It is smooth and not tender. The spermatic cord feels like a firm spaghetti-like cord. It is at the back of your testicle. Examine the crease between the front of your leg and your abdomen. Feel for any bumps that are tender. These could be enlarged lymph nodes.  Document Released: 12/05/2008 Document Revised: 06/29/2013 Document Reviewed: 02/28/2013 [facility] Patient Information 2015 [place], [place]. This information is not intended to replace advice given to you by your health care provider. Make sure you discuss any questions you have with your health care  provider.  Scrotal Masses Scrotal swelling is common in men of all ages. Common types of testicular masses include:   Hydrocele. The most common benign testicular mass in an adult. Hydroceles are generally soft and painless collections of fluid in the scrotal sac. These can rapidly change size as the fluid enters or leaves. Hydroceles can be associated with an underlying cancer of the testicle.  Spermatoceles. Generally soft and painless cyst-like masses in the scrotum that contain fluid, usually above the testicle. They can rapidly change size as the fluid enters or leaves. They are more prominent while standing or exercising. Sometimes, spermatoceles may cause a sensation of heaviness or a dull ache.  Orchitis. Inflammation of the testicle. It is painful and may be associated with a fever or symptoms of a urinary tract infection, including frequent and painful urination. It is common in males who have the mumps.  Varicocele. An enlargement of the veins that drain the testicles. Varicoceles usually occur on the left side of the scrotum. This condition can increase the risk of infertility. Varicocele is sometimes more prominent while standing or exercising. Sometimes, varicoceles may cause a sensation of heaviness or a dull ache.  Inguinal hernia. A bulge caused by a portion of intestine protruding into the scrotum through a weak area in the abdominal muscles. Hernias may or may not be painful. They are soft and usually enlarge with coughing or straining.  Torsion of the testis. This can cause a testicular mass that develops quickly and is associated with tenderness or fever, or both. It is caused by a twisting of the testicle within the sac. It also reduces the blood supply and  can destroy the testis if not treated quickly with surgery.  Epididymitis. Inflammation of the epididymis (a structure attached above and behind the testicle), usually caused by a urinary tract infection or a sexually  transmitted infection. This generally shows up as testicular discomfort and swelling and may include pain during urination. It is frequently associated with a testicle infection.  Testicular appendages. Remnants of tissue on the testis present since birth. A testicular appendage can twist on its blood supply and cause pain. In most cases, this is seen as a blue dot on the scrotum.  Hematocele. A collection of blood between the layers of the sac inside the scrotum. It usually is caused by trauma to the scrotum.  Sebaceous cysts. These can be a swelling in the skin of the scrotum and are usually painless.  Cancer (carcinoma) of the skin of the scrotum. It can cause scrotal swelling, but this is rare. Document Released: 03/15/2003 Document Revised: 05/11/2013 Document Reviewed: 02/28/2013 [facility] Patient Information 2015 [place], [place]. This information is not intended to replace advice given to you by your health care provider. Make sure you discuss any questions you have with your health care provider.  Hydrocele, Adult Fluid can collect around the testicles. This fluid forms in a sac. This condition is called a hydrocele. The collected fluid causes swelling of the scrotum. Usually, it affects just one testicle. Most of the time, the condition does not cause pain. Sometimes, the hydrocele goes away on its own. Other times, surgery is needed to get rid of the fluid. CAUSES A hydrocele does not develop often. Different things can cause a hydrocele in a man, including:  Injury to the scrotum.  Infection.  X-ray of the area around the scrotum.  A tumor or cancer of the testicle.  Twisting of a testicle.  Decreased blood flow to the scrotum. SYMPTOMS   Swelling without pain. The hydrocele feels like a water-filled balloon.  Swelling with pain. This can occur if the hydrocele was caused by infection or twisting.  Mild discomfort in the scrotum.  The hydrocele may feel  heavy.  Swelling that gets smaller when you lie down. DIAGNOSIS  Your caregiver will do a physical exam to decide if you have a hydrocele. This may include:  Asking questions about your overall health, today and in the past. Your caregiver may ask about any injuries, X-rays, or infections.  Pushing on your abdomen or asking you to change positions to see if the size of the hydrocele changes.  Shining a light through the scrotum (transillumination) to see if the fluid inside the scrotum is clear.  Blood tests and urine tests to check for infection.  Imaging studies that take pictures of the scrotum and testicles. TREATMENT  Treatment depends in part on what caused the condition. Options include:  Watchful waiting. Your caregiver checks the hydrocele every so often.  Different surgeries to drain the fluid.  A needle may be put into the scrotum to drain fluid (needle aspiration). Fluid often returns after this type of treatment.  A cut (incision) may be made in the scrotum to remove the fluid sac (hydrocelectomy).  An incision may be made in the groin to repair a hydrocele that has contact with abdominal fluids (communicating hydrocele).  Medicines to treat an infection (antibiotics). HOME CARE INSTRUCTIONS  What you need to do at home may depend on the cause of the hydrocele and type of treatment. In general:  Take all medicine as directed by your caregiver. Follow  the directions carefully.  Ask your caregiver if there is anything you should not do while you recover (activities, lifting, work, sex).  If you had surgery to repair a communicating hydrocele, recovery time may vary. Ask you caregiver about your recovery time.  Avoid heavy lifting for 4 to 6 weeks.  If you had an incision on the scrotum or groin, wash it for 2 to 3 days after surgery. Do this as long as the skin is closed and there are no gaps in the wound. Wash gently, and avoid rubbing the incision.  Keep all  follow-up appointments. SEEK MEDICAL CARE IF:   Your scrotum seems to be getting larger.  The area becomes more and more uncomfortable. SEEK IMMEDIATE MEDICAL CARE IF:  You have a fever. Document Released: 02/26/2010 Document Revised: 06/29/2013 Document Reviewed: 02/26/2010 [facility] Patient Information 2015 [place], [place]. This information is not intended to replace advice given to you by your health care provider. Make sure you discuss any questions you have with your health care provider.  Emergency Department Resource Guide 1) Find a Doctor and Pay Out of Pocket Although you won't have to find out who is covered by your insurance plan, it is a good idea to ask around and get recommendations. You will then need to call the office and see if the doctor you have chosen will accept you as a new patient and what types of options they offer for patients who are self-pay. Some doctors offer discounts or will set up payment plans for their patients who do not have insurance, but you will need to ask so you aren't surprised when you get to your appointment.  2) Contact Your Local Health Department Not all health departments have doctors that can see patients for sick visits, but many do, so it is worth a call to see if yours does. If you don't know where your local health department is, you can check in your phone book. The CDC also has a tool to help you locate your state's health department, and many state websites also have listings of all of their local health departments.  3) Find a Walk-in Clinic If your illness is not likely to be very severe or complicated, you may want to try a walk in clinic. These are popping up all over the country in pharmacies, drugstores, and shopping centers. They're usually staffed by nurse practitioners or physician assistants that have been trained to treat common illnesses and complaints. They're usually fairly quick and inexpensive. However, if you have serious  medical issues or chronic medical problems, these are probably not your best option.  No Primary Care Doctor: - Call Health Connect at  [phone] - they can help you locate a primary care doctor that  accepts your insurance, provides certain services, etc. - Physician Referral Service- [phone]  Chronic Pain Problems: Organization         Address  Phone   Notes  [facility]  [phone] Patients need to be referred by their primary care doctor.   Medication Assistance: Organization         Address  Phone   Notes  [facility] Medication [facility] [address] [place], [place] [address] [phone] --Must be a resident of [facility] -- Must have NO insurance coverage whatsoever (no Medicaid/ Medicare, etc.) -- The pt. MUST have a primary care doctor that directs their care regularly and follows them in the community   MedAssist  (  [phone]   [employer]  [phone]    Agencies that provide inexpensive medical care: Organization         Address  Phone   Notes  [facility]  [phone]   [facility]    [phone]   [facility] [address] [place], [place] [address] [phone]   [facility] [address] [place] [place] [phone]   Planned Parenthood    [phone]   [facility]    [phone]   [facility]  [address], [place] Phone:  [phone], Fax:  [phone] Hours of Operation:  9 am - 6 pm, M-F.  Also accepts Medicaid/Medicare and self-pay.  [facility]  [address], [place] Phone: [phone], Fax: [phone]. Hours of Operation:  8:30 am - 5:30 pm, M-F.  Also accepts Medicaid and self-pay.  [facility] [place] [address] [place] Phone: [phone]   [facility] [address] Natasha Bence  [place], [place] [phone], Ext. [phone] Mondays & Thursdays: 7-9 AM.  First 15 patients are seen on a first come, first serve basis.    Medicaid-accepting [facility] Providers:  Organization         Address  Phone   Notes  [facility] [address], [place] [phone] Also accepts self-pay patients.  [facility] [address] [place], [place]  [phone]   [facility] [address], [place] [phone]   [facility] [address] [place] [phone]   Renaye Rakers [address], [place]   [phone] Only accepts [place] Access [place] patients after they have their name applied to their card.   Self-Pay (no insurance) in [facility]:  Organization         Address  Phone   Notes  Sickle Cell Patients, [facility] [address] [place], [place] [phone]   [facility] [address] [place], [place] [phone]   [facility]  [address], [place] [phone]   [facility]/Dr. Osei-Bonsu  [address], [place] or [address], [place] [phone] Phone number for both [place] and [place] locations is the same.  Urgent Medical and [facility] [address] [place] [phone]   [facility] [address] [place] or [address] [phone]   [facility] [address], [place] [phone], phone; [phone], fax Sees patients 1st and 3rd Saturday of every month.  Must not qualify for public or private insurance (i.e. Medicaid, Medicare, [place] Health Choice, Veterans' Benefits)  Household income should be no more than 200% of the poverty level The clinic cannot treat you if you are pregnant or think you are pregnant  Sexually  transmitted diseases are not treated at the clinic.    Dental Care: Organization         Address  Phone  Notes  [facility] Department of [facility] [address] [place], [place] [phone] Accepts children up to age 78 who are enrolled in [place] or [place] Health Choice; pregnant women  with a Medicaid card; and children who have applied for Medicaid or [place] Health Choice, but were declined, whose parents can pay a reduced fee at time of service.  [facility] Department of [facility]  [address], [place] [phone] Accepts children up to age 65 who are enrolled in [place] or [place] Health Choice; pregnant women with a Medicaid card; and children who have applied for Medicaid or [place] Health Choice, but were declined, whose parents can pay a reduced fee at time of service.  [facility]  [address] [place], [place] [phone] Patients are seen by appointment only. Walk-ins are not accepted. [facility] will see patients 35 years of age and older. Monday - Tuesday (8am-5pm) Most Wednesdays (8:30-5pm) $30 per visit, cash only  [facility] Adult Dental Access [facility]  [address], [place] [facility] [phone] Patients are seen by appointment only. Walk-ins are not accepted. [facility] will see patients 39 years of age and older. One Wednesday Evening (Monthly: Volunteer Based).  $30 per visit, cash only  [employer]  [phone] for adults; Children under age 29, call Graduate Pediatric Dentistry at [phone]. Children aged 31-14, please call [phone] to request a pediatric application.  Dental services are provided in all areas of dental care including fillings, crowns and bridges, complete and partial dentures, implants, gum treatment, root canals, and extractions. Preventive care is also provided. Treatment is provided to both adults and children. Patients are  selected via a lottery and there is often a waiting list.   [facility] [address], [place]  [phone] [email or web address]   [facility] [address] [place], [place] [phone], Ext. [phone] Second and Fourth Thursday of each month, opens at 6:30 AM; Clinic ends at 9 AM.  Patients are seen on a first-come first-served basis, and a limited number are seen during each clinic.   [facility]  [address] [place], [place] [phone]   Eligibility Requirements You must have lived in [place], [place], or [place] counties for at least the last three months.   You cannot be eligible for state or federal sponsored [place], including [employer], [place], or [employer].   You generally cannot be eligible for healthcare insurance through your employer.    How to apply: Eligibility screenings are held every Tuesday and Wednesday afternoon from 1:00 pm until 4:00 pm. You do not need an appointment for the interview!  [facility] [address], [place], [place] [phone]   [facility]  [phone]   [facility] Health Department  [phone]   [facility]  [phone]    Behavioral Health Resources in the Community: Intensive Outpatient Programs Organization         Address  Phone  Notes  [facility] Services [address], [place], [place] [phone]   [facility]patient [address], [place], [place] [phone]   [facility] [address], [place], [place]  [phone]   [facility] [address],  [place], [place] [phone] or [phone]   Substance Abuse Resources Organization         Address  Phone  Notes  [facility] Drug Services  [phone]   [facility]  [phone]   [facility]  [phone]   [facility]  [phone]    Residential & Outpatient Substance Abuse Program  [phone]   Psychological Services Organization         Address  Phone  Notes  [facility] Behavioral Health  [phone]   [facility] Services  [phone]   [facility] Mental Health [address], [place] [phone] or [phone]    Mobile Crisis Teams Organization         Address  Phone  Notes  [facility], Mobile Crisis Care Unit  [phone]   [facility]  [address] [place], [place] [phone]   Doristine Locks [address] [place] [phone]    Self-Help/Support Groups Organization         Address  Phone             Notes  [facility] [place] - variety of support groups  336- I7437963 Call for more information  Narcotics Anonymous (NA), [facility] [address], [employer] [place]  2 meetings at this location   Statistician         Address  Phone  Notes  [facility] [address],    [place]  [phone]   [facility]  [address] [place] [address], [place], [place] [phone]   [facility] [address] [place], [place] [phone] Admissions: 8am-3pm M-F  [facility] [address],    [place], [place] [phone]   [facility] [address] [place], [place], [place] [phone]   [facility] [address],  [place], [place] [phone]   [facility] - Intensive Outpatient [address], [place], [place] [phone]   [facility] ([facility]iction [facility] [address] [place]  [place], [place] [phone] or [phone]   [facility] ([facility]) [address], [place], [place] [phone] Accepts Medicaid  [facility] [address]  [place] [phone] Substance Abuse/Addiction Treatment   [facility] Organization         Address  Phone  Notes  [facility]  [phone]   Angie Fava, PhD [address] Ervin Knack [place], [place]   [phone] or [phone]   [facility] Behavioral   [address] [place], [place] [phone]   [facility] [address], [place], [place] [phone] Insurance/Medicaid/sponsorship through [facility] and Families [address]                                    [place], [place] [phone] Therapy/tele-psych/case  [facility] [address], [place] [phone]    Dr. Lolly Mustache  [phone]   [facility]  [employer] [facility] Dept. 1) [address] [place] 2) [address], [place] 3)  [address], [place] [phone]  [phone]   [facility] Child Abuse Hotline [phone]) 049-0509 or 9075976937 (After Hours)

## 2014-05-16 NOTE — ED Notes (Signed)
Pt alert, arrives from home, c/o "knots" in testicle, onset several months ago, states seen in prison, had U/S completed, has had no f/u since being released, states areas are painful

## 2014-05-16 NOTE — ED Notes (Signed)
Initial Contact - pt A+Ox4, reports c/o 8/10 testicular pain, pt reports feeling "knots" present.  Pt reports had been seen in prison with U/S done with no significant results.  Pt denies other specific complaints.  Ambulatory without issue.  Pt appears comfortable.  Denies urinary complaints.  Skin PWD.  Speaking full/clear sentences. Changed to hospital gown.  NAD.

## 2014-05-16 NOTE — ED Provider Notes (Signed)
Medical screening examination/treatment/procedure(s) were performed by non-physician practitioner and as supervising physician I was immediately available for consultation/collaboration.   EKG Interpretation None        Gilda Crease, MD 05/16/14 1556

## 2014-05-17 LAB — GC/CHLAMYDIA PROBE AMP
CT PROBE, AMP APTIMA: NEGATIVE
GC Probe RNA: NEGATIVE

## 2014-09-12 ENCOUNTER — Emergency Department (INDEPENDENT_AMBULATORY_CARE_PROVIDER_SITE_OTHER)
Admission: EM | Admit: 2014-09-12 | Discharge: 2014-09-12 | Disposition: A | Discharge status: Home / Self Care | Payer: Self-pay | Source: Home / Self Care | Attending: Family Medicine | Admitting: Family Medicine

## 2014-09-12 ENCOUNTER — Encounter (HOSPITAL_COMMUNITY): Payer: Self-pay | Admitting: *Deleted

## 2014-09-12 DIAGNOSIS — J069 Acute upper respiratory infection, unspecified: Secondary | ICD-10-CM

## 2014-09-12 MED ORDER — DEXTROMETHORPHAN POLISTIREX 30 MG/5ML PO LQCR: 60.0000 mg | Freq: Two times a day (BID) | ORAL | Status: DC

## 2014-09-12 MED ORDER — IPRATROPIUM BROMIDE 0.06 % NA SOLN: 2.0000 | Freq: Four times a day (QID) | NASAL | Status: DC

## 2014-09-12 NOTE — Discharge Instructions (Signed)
Take all of medicine, drink lots of fluids, no more smoking, see your doctor if further problems  °

## 2014-09-12 NOTE — ED Provider Notes (Signed)
CSN: [id number]     Arrival date & time 09/12/14  [id number]   First MD Initiated Contact with Patient 09/12/14 0900     Chief Complaint  Patient presents with  . URI   (Consider location/radiation/quality/duration/timing/severity/associated sxs/prior Treatment) Patient is a 33 y.o. male presenting with URI. The history is provided by the patient.  URI Presenting symptoms: congestion, cough and rhinorrhea   Presenting symptoms: no fever and no sore throat   Severity:  Mild Duration:  2 days Chronicity:  New Relieved by:  None tried Worsened by:  Nothing tried Ineffective treatments:  None tried Associated symptoms: no wheezing   Risk factors: sick contacts     Past Medical History  Diagnosis Date  . Genital herpes   . Migraines    History reviewed. No pertinent past surgical history. History reviewed. No pertinent family history. History  Substance Use Topics  . Smoking status: Current Every Day Smoker -- 0.25 packs/day    Types: Cigarettes  . Smokeless tobacco: Not on file  . Alcohol Use: No    Review of Systems  Constitutional: Negative.  Negative for fever.  HENT: Positive for congestion, postnasal drip and rhinorrhea. Negative for sore throat.   Respiratory: Positive for cough. Negative for wheezing.   Gastrointestinal: Negative.     Allergies  Ciprofloxacin and Keflex  Home Medications   Prior to Admission medications   Medication Sig Start Date End Date Taking? Authorizing Provider  dextromethorphan (DELSYM) 30 MG/5ML liquid Take 10 mLs (60 mg total) by mouth 2 (two) times daily. 09/12/14   Linna HoffJames D Richetta Cubillos, MD  ipratropium (ATROVENT) 0.06 % nasal spray Place 2 sprays into both nostrils 4 (four) times daily. 09/12/14   Linna HoffJames D Mikiya Nebergall, MD  naproxen (NAPROSYN) 500 MG tablet Take 1 tablet (500 mg total) by mouth 2 (two) times daily as needed for mild pain, moderate pain or headache (TAKE WITH MEALS.). 05/16/14   Mercedes Strupp Camprubi-Soms, PA-C   oxyCODONE-acetaminophen (PERCOCET) 5-325 MG per tablet Take 1-2 tablets by mouth every 6 (six) hours as needed for severe pain. 05/16/14   Mercedes Strupp Camprubi-Soms, PA-C  trimethoprim-polymyxin b (POLYTRIM) ophthalmic solution Place 1 drop into both eyes every 4 (four) hours. 04/03/14   Roxy Horsemanobert Browning, PA-C   BP 144/90 mmHg  Pulse 96  Temp(Src) 99.8 F (37.7 C) (Oral)  Resp 16  SpO2 98% Physical Exam  Constitutional: He is oriented to person, place, and time. He appears well-developed and well-nourished. No distress.  HENT:  Head: Normocephalic.  Right Ear: External ear normal.  Left Ear: External ear normal.  Mouth/Throat: Oropharynx is clear and moist.  Eyes: Conjunctivae are normal. Pupils are equal, round, and reactive to light.  Neck: Normal range of motion. Neck supple.  Cardiovascular: Normal heart sounds and intact distal pulses.   Pulmonary/Chest: Effort normal and breath sounds normal.  Lymphadenopathy:    He has no cervical adenopathy.  Neurological: He is alert and oriented to person, place, and time.  Skin: Skin is warm and dry.  Nursing note and vitals reviewed.   ED Course  Procedures (including critical care time) Labs Review Labs Reviewed - No data to display  Imaging Review No results found.   MDM   1. URI (upper respiratory infection)        Linna HoffJames D Seanne Chirico, MD 09/12/14 [phone]

## 2014-09-12 NOTE — ED Notes (Signed)
Pt   Reports      Symptoms    Of    Congested      Coughing  Up      Blood  Tinged  Sputum      As  Well  As  Headache     And  Nasal  stuffyness      Pt  Reports    Symptoms  Developed  Yesterday      Sitting  Upright on  Exam table  Speaking in  Complete  sentances

## 2014-09-23 ENCOUNTER — Emergency Department (HOSPITAL_COMMUNITY): Payer: Self-pay

## 2014-09-23 ENCOUNTER — Emergency Department (HOSPITAL_COMMUNITY)
Admission: EM | Admit: 2014-09-23 | Discharge: 2014-09-24 | Disposition: A | Discharge status: Home / Self Care | Payer: Self-pay | Attending: Emergency Medicine | Admitting: Emergency Medicine

## 2014-09-23 ENCOUNTER — Encounter (HOSPITAL_COMMUNITY): Payer: Self-pay | Admitting: Emergency Medicine

## 2014-09-23 DIAGNOSIS — R519 Headache, unspecified: Secondary | ICD-10-CM

## 2014-09-23 DIAGNOSIS — Z720 Tobacco use: Secondary | ICD-10-CM | POA: Insufficient documentation

## 2014-09-23 DIAGNOSIS — F1410 Cocaine abuse, uncomplicated: Secondary | ICD-10-CM | POA: Insufficient documentation

## 2014-09-23 DIAGNOSIS — Z8669 Personal history of other diseases of the nervous system and sense organs: Secondary | ICD-10-CM | POA: Insufficient documentation

## 2014-09-23 DIAGNOSIS — R0602 Shortness of breath: Secondary | ICD-10-CM | POA: Insufficient documentation

## 2014-09-23 DIAGNOSIS — Z792 Long term (current) use of antibiotics: Secondary | ICD-10-CM | POA: Insufficient documentation

## 2014-09-23 DIAGNOSIS — F1910 Other psychoactive substance abuse, uncomplicated: Secondary | ICD-10-CM

## 2014-09-23 DIAGNOSIS — R002 Palpitations: Secondary | ICD-10-CM | POA: Insufficient documentation

## 2014-09-23 DIAGNOSIS — Z8619 Personal history of other infectious and parasitic diseases: Secondary | ICD-10-CM | POA: Insufficient documentation

## 2014-09-23 DIAGNOSIS — Z79899 Other long term (current) drug therapy: Secondary | ICD-10-CM | POA: Insufficient documentation

## 2014-09-23 DIAGNOSIS — R079 Chest pain, unspecified: Secondary | ICD-10-CM | POA: Insufficient documentation

## 2014-09-23 DIAGNOSIS — H538 Other visual disturbances: Secondary | ICD-10-CM | POA: Insufficient documentation

## 2014-09-23 DIAGNOSIS — R51 Headache: Secondary | ICD-10-CM | POA: Insufficient documentation

## 2014-09-23 HISTORY — DX: Cocaine dependence, uncomplicated: F14.20

## 2014-09-23 HISTORY — DX: Other psychoactive substance abuse, uncomplicated: F19.10

## 2014-09-23 LAB — BASIC METABOLIC PANEL
Anion gap: 9 (ref 5–15)
BUN: 14 mg/dL (ref 6–23)
CO2: 25 mmol/L (ref 19–32)
Calcium: 9.7 mg/dL (ref 8.4–10.5)
Chloride: 103 mEq/L (ref 96–112)
Creatinine, Ser: 1.19 mg/dL (ref 0.50–1.35)
GFR, EST NON AFRICAN AMERICAN: 78 mL/min — AB (ref >90)
Glucose, Bld: 121 mg/dL — ABNORMAL HIGH (ref 70–99)
Potassium: 3.5 mmol/L (ref 3.5–5.1)
Sodium: 137 mmol/L (ref 135–145)

## 2014-09-23 LAB — CBC
HCT: 46.5 % (ref 39.0–52.0)
Hemoglobin: 15.6 g/dL (ref 13.0–17.0)
MCH: 28.7 pg (ref 26.0–34.0)
MCHC: 33.5 g/dL (ref 30.0–36.0)
MCV: 85.5 fL (ref 78.0–100.0)
Platelets: 240 10*3/uL (ref 150–400)
RBC: 5.44 MIL/uL (ref 4.22–5.81)
RDW: 13.4 % (ref 11.5–15.5)
WBC: 9.3 10*3/uL (ref 4.0–10.5)

## 2014-09-23 LAB — I-STAT TROPONIN, ED: Troponin i, poc: 0 ng/mL (ref 0.00–0.08)

## 2014-09-23 LAB — BRAIN NATRIURETIC PEPTIDE: B Natriuretic Peptide: 12.7 pg/mL (ref 0.0–100.0)

## 2014-09-23 MED ORDER — SODIUM CHLORIDE 0.9 % IV BOLUS (SEPSIS)
1000.0000 mL | Freq: Once | INTRAVENOUS | Status: AC
  Administered 2014-09-23: 1000 mL via INTRAVENOUS

## 2014-09-23 NOTE — ED Notes (Signed)
Pt. reports mid chest pain with SOB and productive cough onset 2 days ago , pt. also stated he has taken Cocaine this evening.

## 2014-09-23 NOTE — ED Provider Notes (Signed)
CSN: [id number]     Arrival date & time 09/23/14  2131 History   First MD Initiated Contact with Patient 09/23/14 2316     Chief Complaint  Patient presents with  . Chest Pain     (Consider location/radiation/quality/duration/timing/severity/associated sxs/prior Treatment) HPI Comments: The patient is a 34 year old male with past mental history of substance abuse, migraines presents emergency room with chief complaint of chest pain. Patient reports central chest pain for 2 weeks, worsened tonight after smoking crack cocaine. Patient reports persistent symptoms since denies aggravating or relieving factors, nonexertional. Patient reports associated shortness breath, palpitations. Denies lower extremity edema. Patient reports ongoing productive cough for 2 weeks.   Patient also complains of left-sided headache and blurry vision. He reports this is common after smoking crack cocaine while coming off a high. Patient denies other neurologic symptoms. Reports he has been smoking crack since the age of 37. And that crack cocaine as his "drug of choice", reports had increase in stress and relies on drugs for these times.. He denies IV drug use, amphetamine use, alcohol use.   Patient is a 34 y.o. male presenting with chest pain. The history is provided by the patient. No language interpreter was used.  Chest Pain Associated symptoms: abdominal pain and shortness of breath   Associated symptoms: no fever, no nausea and not vomiting     Past Medical History  Diagnosis Date  . Genital herpes   . Migraines   . Polysubstance abuse   . Cocaine addiction    History reviewed. No pertinent past surgical history. No family history on file. History  Substance Use Topics  . Smoking status: Current Every Day Smoker -- 0.25 packs/day    Types: Cigarettes  . Smokeless tobacco: Not on file  . Alcohol Use: No    Review of Systems  Constitutional: Negative for fever and chills.  Respiratory: Positive  for shortness of breath.   Cardiovascular: Positive for chest pain. Negative for leg swelling.  Gastrointestinal: Positive for abdominal pain. Negative for nausea, vomiting, diarrhea and constipation.      Allergies  Ciprofloxacin and Keflex  Home Medications   Prior to Admission medications   Medication Sig Start Date End Date Taking? Authorizing Provider  dextromethorphan (DELSYM) 30 MG/5ML liquid Take 10 mLs (60 mg total) by mouth 2 (two) times daily. 09/12/14   Linna Hoff, MD  ipratropium (ATROVENT) 0.06 % nasal spray Place 2 sprays into both nostrils 4 (four) times daily. 09/12/14   Linna Hoff, MD  naproxen (NAPROSYN) 500 MG tablet Take 1 tablet (500 mg total) by mouth 2 (two) times daily as needed for mild pain, moderate pain or headache (TAKE WITH MEALS.). 05/16/14   Mercedes Strupp Camprubi-Soms, PA-C  oxyCODONE-acetaminophen (PERCOCET) 5-325 MG per tablet Take 1-2 tablets by mouth every 6 (six) hours as needed for severe pain. 05/16/14   Mercedes Strupp Camprubi-Soms, PA-C  trimethoprim-polymyxin b (POLYTRIM) ophthalmic solution Place 1 drop into both eyes every 4 (four) hours. 04/03/14   Roxy Horseman, PA-C   BP 142/88 mmHg  Pulse 103  Temp(Src) 97.8 F (36.6 C) (Oral)  Resp 18  SpO2 95% Physical Exam  Constitutional: He is oriented to person, place, and time. He appears well-developed and well-nourished. No distress.  Cardiovascular: Normal rate and regular rhythm.   No lower extremity edema  Pulmonary/Chest: Effort normal and breath sounds normal. He has no wheezes. He has no rales. He exhibits tenderness.    Abdominal: Soft. There is tenderness. There is  no rigidity and no guarding.  Neurological: He is alert and oriented to person, place, and time. No cranial nerve deficit or sensory deficit. GCS eye subscore is 4. GCS verbal subscore is 5. GCS motor subscore is 6.  Appears under the influence of substance. Speech is clear and goal oriented, follows  commands II-Visual fields were normal.   III/IV/VI-Pupils were equal and reacted. V/VII-no facial droop.   VIII-normal.   Motor: Strength 5/5 to upper and lower extremities bilaterally. Moves all 4 extremities equally. Sensory: normal sensation to upper and lower extremities.  Cerebellar: No pronator drift.  Skin: He is not diaphoretic.  Nursing note and vitals reviewed.   ED Course  Procedures (including critical care time) Labs Review Labs Reviewed  BASIC METABOLIC PANEL - Abnormal; Notable for the following:    Glucose, Bld 121 (*)    GFR calc non Af Amer 78 (*)    All other components within normal limits  HEPATIC FUNCTION PANEL - Abnormal; Notable for the following:    AST 72 (*)    All other components within normal limits  CBC  BRAIN NATRIURETIC PEPTIDE  D-DIMER, QUANTITATIVE  I-STAT TROPOININ, ED  Rosezena Sensor, ED    Imaging Review Dg Chest 2 View  09/23/2014   CLINICAL DATA:  Acute onset of congestion and cough for 2 weeks. Initial encounter.  EXAM: CHEST  2 VIEW  COMPARISON:  Chest radiograph performed 03/06/2014  FINDINGS: The lungs are well-aerated and clear. There is no evidence of focal opacification, pleural effusion or pneumothorax.  The heart is normal in size; the mediastinal contour is within normal limits. No acute osseous abnormalities are seen.  IMPRESSION: No acute cardiopulmonary process seen.   Electronically Signed   By: Roanna Raider M.D.   On: 09/23/2014 22:24   Ct Head Wo Contrast  09/24/2014   CLINICAL DATA:  Headache. Cocaine use this evening. Chest pain shortness of breath and productive cough since 2 days.  EXAM: CT HEAD WITHOUT CONTRAST  TECHNIQUE: Contiguous axial images were obtained from the base of the skull through the vertex without intravenous contrast.  COMPARISON:  07/31/2012  FINDINGS: Ventricles and sulci appear symmetrical. No mass effect or midline shift. No abnormal extra-axial fluid collections. Gray-white matter junctions are  distinct. Basal cisterns are not effaced. No evidence of acute intracranial hemorrhage. No depressed skull fractures. Minimal mucosal thickening in the paranasal sinuses. No acute air-fluid levels. Mastoid air cells are not opacified.  IMPRESSION: No acute intracranial abnormalities. Minimal inflammatory mucosal thickening in the paranasal sinuses.   Electronically Signed   By: Burman Nieves M.D.   On: 09/24/2014 00:30     EKG Interpretation   Date/Time:  Saturday September 23 2014 21:38:20 EST Ventricular Rate:  95 PR Interval:  162 QRS Duration: 86 QT Interval:  354 QTC Calculation: 444 R Axis:   63 Text Interpretation:  Normal sinus rhythm Normal ECG since last tracing no  significant change Confirmed by BELFI  MD, MELANIE ([id number]) on 09/24/2014  6:43:13 AM      MDM   Final diagnoses:  Chest pain   Patient presents with chest pain after crack cocaine, plan to rule out ACS given increase risk. Patient also complains of headache consistent with previous headaches after crack cocaine use and blurry vision also similar symptoms in the past. Plan to CT head to rule out intracranial processes. EKG normal, initial troponin negative, CBC and CMP without concerning abnormalities. Chest x-ray negative. CT shows sinusitis no other abnormalities. 130  Reevaluation patient reports mild resolution of cramping. Discussed negative workup so far and need for repeat troponin. Pt care assumed by Dr. Fredderick Phenix at shift change. Awaiting second troponin results and likely D/C home.  Mellody Drown, PA-C 09/26/14 1214  Rolan Bucco, MD 09/30/14 [phone]

## 2014-09-24 LAB — HEPATIC FUNCTION PANEL
ALT: 47 U/L (ref 0–53)
AST: 72 U/L — ABNORMAL HIGH (ref 0–37)
Albumin: 4.1 g/dL (ref 3.5–5.2)
Alkaline Phosphatase: 72 U/L (ref 39–117)
Bilirubin, Direct: <0.1 mg/dL (ref 0.0–0.3)
TOTAL PROTEIN: 7 g/dL (ref 6.0–8.3)
Total Bilirubin: 0.5 mg/dL (ref 0.3–1.2)

## 2014-09-24 LAB — I-STAT TROPONIN, ED: TROPONIN I, POC: 0.02 ng/mL (ref 0.00–0.08)

## 2014-09-24 LAB — D-DIMER, QUANTITATIVE (NOT AT ARMC): D-Dimer, Quant: <0.27 ug/mL-FEU (ref 0.00–0.48)

## 2014-09-24 MED ORDER — SODIUM CHLORIDE 0.9 % IV BOLUS (SEPSIS)
1000.0000 mL | Freq: Once | INTRAVENOUS | Status: AC
  Administered 2014-09-24: 1000 mL via INTRAVENOUS

## 2014-09-24 NOTE — Discharge Instructions (Signed)
Chest Pain (Nonspecific) °It is often hard to give a specific diagnosis for the cause of chest pain. There is always a chance that your pain could be related to something serious, such as a heart attack or a blood clot in the lungs. You need to follow up with your health care provider for further evaluation. °CAUSES  °· Heartburn. °· Pneumonia or bronchitis. °· Anxiety or stress. °· Inflammation around your heart (pericarditis) or lung (pleuritis or pleurisy). °· A blood clot in the lung. °· A collapsed lung (pneumothorax). It can develop suddenly on its own (spontaneous pneumothorax) or from trauma to the chest. °· Shingles infection (herpes zoster virus). °The chest wall is composed of bones, muscles, and cartilage. Any of these can be the source of the pain. °· The bones can be bruised by injury. °· The muscles or cartilage can be strained by coughing or overwork. °· The cartilage can be affected by inflammation and become sore (costochondritis). °DIAGNOSIS  °Lab tests or other studies may be needed to find the cause of your pain. Your health care provider may have you take a test called an ambulatory electrocardiogram (ECG). An ECG records your heartbeat patterns over a 24-hour period. You may also have other tests, such as: °· Transthoracic echocardiogram (TTE). During echocardiography, sound waves are used to evaluate how blood flows through your heart. °· Transesophageal echocardiogram (TEE). °· Cardiac monitoring. This allows your health care provider to monitor your heart rate and rhythm in real time. °· Holter monitor. This is a portable device that records your heartbeat and can help diagnose heart arrhythmias. It allows your health care provider to track your heart activity for several days, if needed. °· Stress tests by exercise or by giving medicine that makes the heart beat faster. °TREATMENT  °· Treatment depends on what may be causing your chest pain. Treatment may include: °¨ Acid blockers for  heartburn. °¨ Anti-inflammatory medicine. °¨ Pain medicine for inflammatory conditions. °¨ Antibiotics if an infection is present. °· You may be advised to change lifestyle habits. This includes stopping smoking and avoiding alcohol, caffeine, and chocolate. °· You may be advised to keep your head raised (elevated) when sleeping. This reduces the chance of acid going backward from your stomach into your esophagus. °Most of the time, nonspecific chest pain will improve within 2-3 days with rest and mild pain medicine.  °HOME CARE INSTRUCTIONS  °· If antibiotics were prescribed, take them as directed. Finish them even if you start to feel better. °· For the next few days, avoid physical activities that bring on chest pain. Continue physical activities as directed. °· Do not use any tobacco products, including cigarettes, chewing tobacco, or electronic cigarettes. °· Avoid drinking alcohol. °· Only take medicine as directed by your health care provider. °· Follow your health care provider's suggestions for further testing if your chest pain does not go away. °· Keep any follow-up appointments you made. If you do not go to an appointment, you could develop lasting (chronic) problems with pain. If there is any problem keeping an appointment, call to reschedule. °SEEK MEDICAL CARE IF:  °· Your chest pain does not go away, even after treatment. °· You have a rash with blisters on your chest. °· You have a fever. °SEEK IMMEDIATE MEDICAL CARE IF:  °· You have increased chest pain or pain that spreads to your arm, neck, jaw, back, or abdomen. °· You have shortness of breath. °· You have an increasing cough, or you cough   up blood.  You have severe back or abdominal pain.  You feel nauseous or vomit.  You have severe weakness.  You faint.  You have chills. This is an emergency. Do not wait to see if the pain will go away. Get medical help at once. Call your local emergency services (911 in [place] Do not drive  yourself to the hospital. MAKE SURE YOU:   Understand these instructions.  Will watch your condition.  Will get help right away if you are not doing well or get worse. Document Released: 06/18/2005 Document Revised: 09/13/2013 Document Reviewed: 04/13/2008 [facility] Patient Information 2015 [place], [place]. This information is not intended to replace advice given to you by your health care provider. Make sure you discuss any questions you have with your health care provider.  Polysubstance Abuse When people abuse more than one drug or type of drug it is called polysubstance or polydrug abuse. For example, many smokers also drink alcohol. This is one form of polydrug abuse. Polydrug abuse also refers to the use of a drug to counteract an unpleasant effect produced by another drug. It may also be used to help with withdrawal from another drug. People who take stimulants may become agitated. Sometimes this agitation is countered with a tranquilizer. This helps protect against the unpleasant side effects. Polydrug abuse also refers to the use of different drugs at the same time.  Anytime drug use is interfering with normal living activities, it has become abuse. This includes problems with family and friends. Psychological dependence has developed when your mind tells you that the drug is needed. This is usually followed by physical dependence which has developed when continuing increases of drug are required to get the same feeling or "high". This is known as addiction or chemical dependency. A person's risk is much higher if there is a history of chemical dependency in the family. SIGNS OF CHEMICAL DEPENDENCY  You have been told by friends or family that drugs have become a problem.  You fight when using drugs.  You are having blackouts (not remembering what you do while using).  You feel sick from using drugs but continue using.  You lie about use or amounts of drugs (chemicals) used.  You  need chemicals to get you going.  You are suffering in work performance or in school because of drug use.  You get sick from use of drugs but continue to use anyway.  You need drugs to relate to people or feel comfortable in social situations.  You use drugs to forget problems. "Yes" answered to any of the above signs of chemical dependency indicates there are problems. The longer the use of drugs continues, the greater the problems will become. If there is a family history of drug or alcohol use, it is best not to experiment with these drugs. Continual use leads to tolerance. After tolerance develops more of the drug is needed to get the same feeling. This is followed by addiction. With addiction, drugs become the most important part of life. It becomes more important to take drugs than participate in the other usual activities of life. This includes relating to friends and family. Addiction is followed by dependency. Dependency is a condition where drugs are now needed not just to get high, but to feel normal. Addiction cannot be cured but it can be stopped. This often requires outside help and the care of professionals. Treatment centers are listed in the yellow pages under: Cocaine, Narcotics, and Alcoholics Anonymous. Most  hospitals and clinics can refer you to a specialized care center. Talk to your caregiver if you need help. Document Released: 04/30/2005 Document Revised: 12/01/2011 Document Reviewed: 09/08/2005 [facility] Patient Information 2015 [place], [place]. This information is not intended to replace advice given to you by your health care provider. Make sure you discuss any questions you have with your health care provider. Emergency Department Resource Guide 1) Find a Doctor and Pay Out of Pocket Although you won't have to find out who is covered by your insurance plan, it is a good idea to ask around and get recommendations. You will then need to call the office and see if the doctor  you have chosen will accept you as a new patient and what types of options they offer for patients who are self-pay. Some doctors offer discounts or will set up payment plans for their patients who do not have insurance, but you will need to ask so you aren't surprised when you get to your appointment.  2) Contact Your Local Health Department Not all health departments have doctors that can see patients for sick visits, but many do, so it is worth a call to see if yours does. If you don't know where your local health department is, you can check in your phone book. The CDC also has a tool to help you locate your state's health department, and many state websites also have listings of all of their local health departments.  3) Find a Walk-in Clinic If your illness is not likely to be very severe or complicated, you may want to try a walk in clinic. These are popping up all over the country in pharmacies, drugstores, and shopping centers. They're usually staffed by nurse practitioners or physician assistants that have been trained to treat common illnesses and complaints. They're usually fairly quick and inexpensive. However, if you have serious medical issues or chronic medical problems, these are probably not your best option.  No Primary Care Doctor: - Call Health Connect at  [phone] - they can help you locate a primary care doctor that  accepts your insurance, provides certain services, etc. - Physician Referral Service- [phone]  Chronic Pain Problems: Organization         Address     Phone             Notes  [facility]  [phone] Patients need to be referred by their primary care doctor.   Medication Assistance: Organization         Address     Phone             Notes  [facility] Medication [facility] [address] [place] [place], [place] [address] [phone] --Must be a resident of [facility] -- Must have NO insurance coverage  whatsoever (no Medicaid/ Medicare, etc.) -- The pt. MUST have a primary care doctor that directs their care regularly and follows them in the community   MedAssist  [phone]   [employer]  [phone]    Agencies that provide inexpensive medical care: Organization         Address     Phone             Notes  [facility]  [phone]   [facility]    [phone]   [facility] [address] [place], [place] [address] [phone]   [facility]  Lovenia Shuck, [place] [phone]   Planned Parenthood    [phone]   [facility]    [phone]   [facility]  [address], [place] Phone:  [phone], Fax:  [phone] Hours of Operation:  9 am - 6 pm, M-F.  Also accepts Medicaid/Medicare and self-pay.  [facility]  [address], [place] Phone: [phone], Fax: [phone]. Hours of Operation:  8:30 am - 5:30 pm, M-F.  Also accepts Medicaid and self-pay.  [facility] [place] [address], [place] Phone: [phone]   [facility]     [address] [place]asha [place], [place] [phone], Ext. [phone] Mondays & Thursdays: 7-9 AM.  First 15 patients are seen on a first come, first serve basis.   [facility] [address] [place] [address] [phone] Accepts Medicaid   Medicaid-accepting [facility] Providers:  Organization         Address     Phone             Notes  [facility] [address], [place] [phone] Also accepts self-pay patients.  [facility] [address] Laurell Josephs [place], [place]  [phone]   [facility] [address] [facility] [address], [place] [phone]   [facility] [address] [place] [phone]   Renaye Rakers [address], [place]   [phone] Only accepts [place] Access [place] patients after they have their name applied to their card.   Self-Pay (no insurance) in [facility]:  Organization         Address     Phone             Notes  Sickle Cell Patients, [facility] [address] [place], [place] [phone]   [facility] [address] [place], [place] [phone]   [facility] [place]  [address], [place] [phone]   [facility]/Dr. Osei-Bonsu  [address] [place] or [address], [place] [phone] Phone number for both [place] and [place] locations is the same.  [facility] and [facility] [address] [place] [phone]   [facility] [address] [place] or [address] [phone]   [facility] [address] [place] [phone], phone; [phone], fax Sees patients 1st and 3rd Saturday of every month.  Must not qualify for public or private insurance (i.e. Medicaid, Medicare, [place] Health Choice, Veterans' Benefits)  Household income should be no more than 200% of the poverty level The clinic cannot treat you if you are pregnant or think you are pregnant  Sexually transmitted diseases are not treated at the clinic.    Dental Care:  Organization         Address     Phone             Notes  [facility] [address] [place], [place] [phone] Accepts children up to age 49 who are enrolled in [place] or [place] Health Choice; pregnant women with a Medicaid card; and children who have applied for Medicaid or [place]  Health Choice, but were declined, whose parents can pay a reduced fee at time of service.  [facility]  [address], [place] [phone] Accepts children up to age 45 who are enrolled in [place] or [place] Health Choice; pregnant women with a Medicaid card; and children who have applied for Medicaid or [place] Health Choice, but were declined, whose parents can pay a reduced fee at time of service.  [facility]  [address] [place], [place] [phone] Patients are seen by appointment only. Walk-ins are not accepted. [facility] will see patients 17 years of age and older. Monday - Tuesday (8am-5pm) Most Wednesdays (8:30-5pm) $30 per visit, cash only  [facility] Adult Dental Access [facility]  [address], [facility] [phone] Patients are seen by appointment only. Walk-ins are not accepted. [facility] will see patients 3 years of age and older. One Wednesday Evening (Monthly: Volunteer Based).  $30 per visit, cash only  [employer]  [phone] for adults; Children under age 6, call Graduate Pediatric Dentistry at [phone]. Children aged 69-14, please call [phone] to request a pediatric application.  Dental services are provided in all areas of dental care including fillings, crowns and bridges, complete and partial dentures, implants, gum treatment, root canals, and extractions. Preventive care is also provided. Treatment is provided to both adults and children. Patients are selected via a lottery and there is often a waiting list.   [facility] [address] [place]  [phone] [email or web address]   [facility] [address] [place], [place] [phone], Ext. [phone] Second and Fourth Thursday of each month, opens at 6:30 AM; Clinic ends at 9 AM.  Patients are seen on a first-come first-served basis, and a limited number are seen during each clinic.   [facility]  [address] Ether Griffins [place], [place] [phone]   Eligibility Requirements You must have lived in [place], [place], or [place] counties  for at least the last three months.   You cannot be eligible for state or federal sponsored [place], including [employer], [place], or [employer].   You generally cannot be eligible for healthcare insurance through your employer.    How to apply: Eligibility screenings are held every Tuesday and Wednesday afternoon from 1:00 pm until 4:00 pm. You do not need an appointment for the interview!  [facility] [address] [place], [place] [phone]   [facility]  [phone]   [facility] Health Department  [phone]   [facility] Health Department  [phone]    Behavioral Health Resources in the Community: Intensive Outpatient Programs Organization         Address     Phone             Notes  [facility] Services [address], [place], [place] [phone]   [facility] Outpatient [address], [place], [place] [phone]   [facility] [address], [place], [place]  [phone]   [facility] Mental Health [address],  [place], [place] [phone] or [phone]     Substance Abuse Resources Organization         Address     Phone             Notes  [facility]  [phone]  [facility]  [phone]   [facility]  [phone]   Floydene Flock  [phone]   Residential & Outpatient Substance Abuse Program  [phone]   Psychological Services Organization         Address     Phone             Notes  [facility] Health  [phone]   [facility] Services  [phone]   [facility] [address], [place] [phone] or [phone]    Mobile Crisis Teams Organization         Address     Phone             Notes  [facility], Mobile Crisis Care Unit  [phone]   [facility]  [address] [place], [place] [phone]     Doristine Locks [address] [place] [phone]    Self-Help/Support Groups Organization         Address     Phone             Notes  [facility] [place] - variety of support groups  [phone]- [phone] Call for more information  Narcotics Anonymous (NA), [facility] [address], [employer] [place]  2 meetings at this location   Statistician         Address     Phone             Notes  [facility] [address],    [place]  [phone]   [facility]  [address], [place] [address], [place], [place] [phone]   [facility] [address] [place], [place] [phone] Admissions: 8am-3pm M-F  [facility] [address],    [place], [place] [phone]   [facility] [address] [place], [place], [place] [phone]   [facility] [address]  [place], [place] [phone]   [facility] - Intensive Outpatient [address] Laurell Josephs [address], [place], [place] [phone]   [facility] ([facility] [address] [place]  [place], [place] [phone] or [phone]   [facility] ([facility]) [address] [place], [place] [phone] Accepts Medicaid  [facility] [address],  [place] [phone] Substance Abuse/Addiction Treatment   [facility] Organization         Address     Phone             Notes  [facility]  [phone]   Angie Fava, PhD [address] Ervin Knack [place], [place]   [phone] or [phone]   [facility]al   [address] [place], [place] [phone]   [facility] [address] [place], [place] [phone] Insurance/Medicaid/sponsorship through [facility] and Families [address]                                    [place], [place] [phone] Therapy/tele-psych/case   [facility] [address] [place], [place] [phone]    Dr. Lolly Mustache  [phone]   [facility]  [employer] [facility] Dept. 1) [address], [place] 2) [address], [place] 3)  [address], [place] [phone]  [phone]   [facility] Child Abuse Hotline [phone]) [phone] or [phone] (After Hours)

## 2014-09-24 NOTE — ED Notes (Signed)
MD at bedside. 

## 2014-09-24 NOTE — ED Notes (Signed)
Pt able to tolerate PO fluids.  

## 2014-09-24 NOTE — ED Provider Notes (Signed)
Pt with hx of cocaine abuse presents with CP and headache.  Feeling much better now.  CP free.  Alert and oriented.  Tolerating PO.  Sister to pick him up.  Given outpt resources for substance abuse.  Results for orders placed or performed during the hospital encounter of 09/23/14  CBC  Result Value Ref Range   WBC 9.3 4.0 - 10.5 K/uL   RBC 5.44 4.22 - 5.81 MIL/uL   Hemoglobin 15.6 13.0 - 17.0 g/dL   HCT 16.1 09.6 - 04.5 %   MCV 85.5 78.0 - 100.0 fL   MCH 28.7 26.0 - 34.0 pg   MCHC 33.5 30.0 - 36.0 g/dL   RDW 40.9 81.1 - 91.4 %   Platelets 240 150 - 400 K/uL  Basic metabolic panel  Result Value Ref Range   Sodium 137 135 - 145 mmol/L   Potassium 3.5 3.5 - 5.1 mmol/L   Chloride 103 96 - 112 mEq/L   CO2 25 19 - 32 mmol/L   Glucose, Bld 121 (H) 70 - 99 mg/dL   BUN 14 6 - 23 mg/dL   Creatinine, Ser 7.82 0.50 - 1.35 mg/dL   Calcium 9.7 8.4 - 95.6 mg/dL   GFR calc non Af Amer 78 (L) >90 mL/min   GFR calc Af Amer >90 >90 mL/min   Anion gap 9 5 - 15  BNP (order ONLY if patient complains of dyspnea/SOB AND you have documented it for THIS visit)  Result Value Ref Range   B Natriuretic Peptide 12.7 0.0 - 100.0 pg/mL  D-dimer, quantitative  Result Value Ref Range   D-Dimer, Quant <0.27 0.00 - 0.48 ug/mL-FEU  Hepatic function panel  Result Value Ref Range   Total Protein 7.0 6.0 - 8.3 g/dL   Albumin 4.1 3.5 - 5.2 g/dL   AST 72 (H) 0 - 37 U/L   ALT 47 0 - 53 U/L   Alkaline Phosphatase 72 39 - 117 U/L   Total Bilirubin 0.5 0.3 - 1.2 mg/dL   Bilirubin, Direct <2.1 0.0 - 0.3 mg/dL   Indirect Bilirubin NOT CALCULATED 0.3 - 0.9 mg/dL  I-stat troponin, ED (not at [facility])  Result Value Ref Range   Troponin i, poc 0.00 0.00 - 0.08 ng/mL   Comment 3          I-stat troponin, ED  Result Value Ref Range   Troponin i, poc 0.02 0.00 - 0.08 ng/mL   Comment 3           Dg Chest 2 View  09/23/2014   CLINICAL DATA:  Acute onset of congestion and cough for 2 weeks. Initial encounter.  EXAM: CHEST  2  VIEW  COMPARISON:  Chest radiograph performed 03/06/2014  FINDINGS: The lungs are well-aerated and clear. There is no evidence of focal opacification, pleural effusion or pneumothorax.  The heart is normal in size; the mediastinal contour is within normal limits. No acute osseous abnormalities are seen.  IMPRESSION: No acute cardiopulmonary process seen.   Electronically Signed   By: Roanna Raider M.D.   On: 09/23/2014 22:24   Ct Head Wo Contrast  09/24/2014   CLINICAL DATA:  Headache. Cocaine use this evening. Chest pain shortness of breath and productive cough since 2 days.  EXAM: CT HEAD WITHOUT CONTRAST  TECHNIQUE: Contiguous axial images were obtained from the base of the skull through the vertex without intravenous contrast.  COMPARISON:  07/31/2012  FINDINGS: Ventricles and sulci appear symmetrical. No mass effect or midline shift.  No abnormal extra-axial fluid collections. Gray-white matter junctions are distinct. Basal cisterns are not effaced. No evidence of acute intracranial hemorrhage. No depressed skull fractures. Minimal mucosal thickening in the paranasal sinuses. No acute air-fluid levels. Mastoid air cells are not opacified.  IMPRESSION: No acute intracranial abnormalities. Minimal inflammatory mucosal thickening in the paranasal sinuses.   Electronically Signed   By: Burman Nieves M.D.   On: 09/24/2014 00:30      Rolan Bucco, MD 09/24/14 [phone]

## 2015-01-02 IMAGING — CR DG CHEST 2V
2 series · 2 of 2 positions shown · non-contrast
Comparison: Chest radiograph performed 02/04/2014

CLINICAL DATA: Chest pain after taking unknown pills.

EXAM:
CHEST  2 VIEW

[w chest pa]
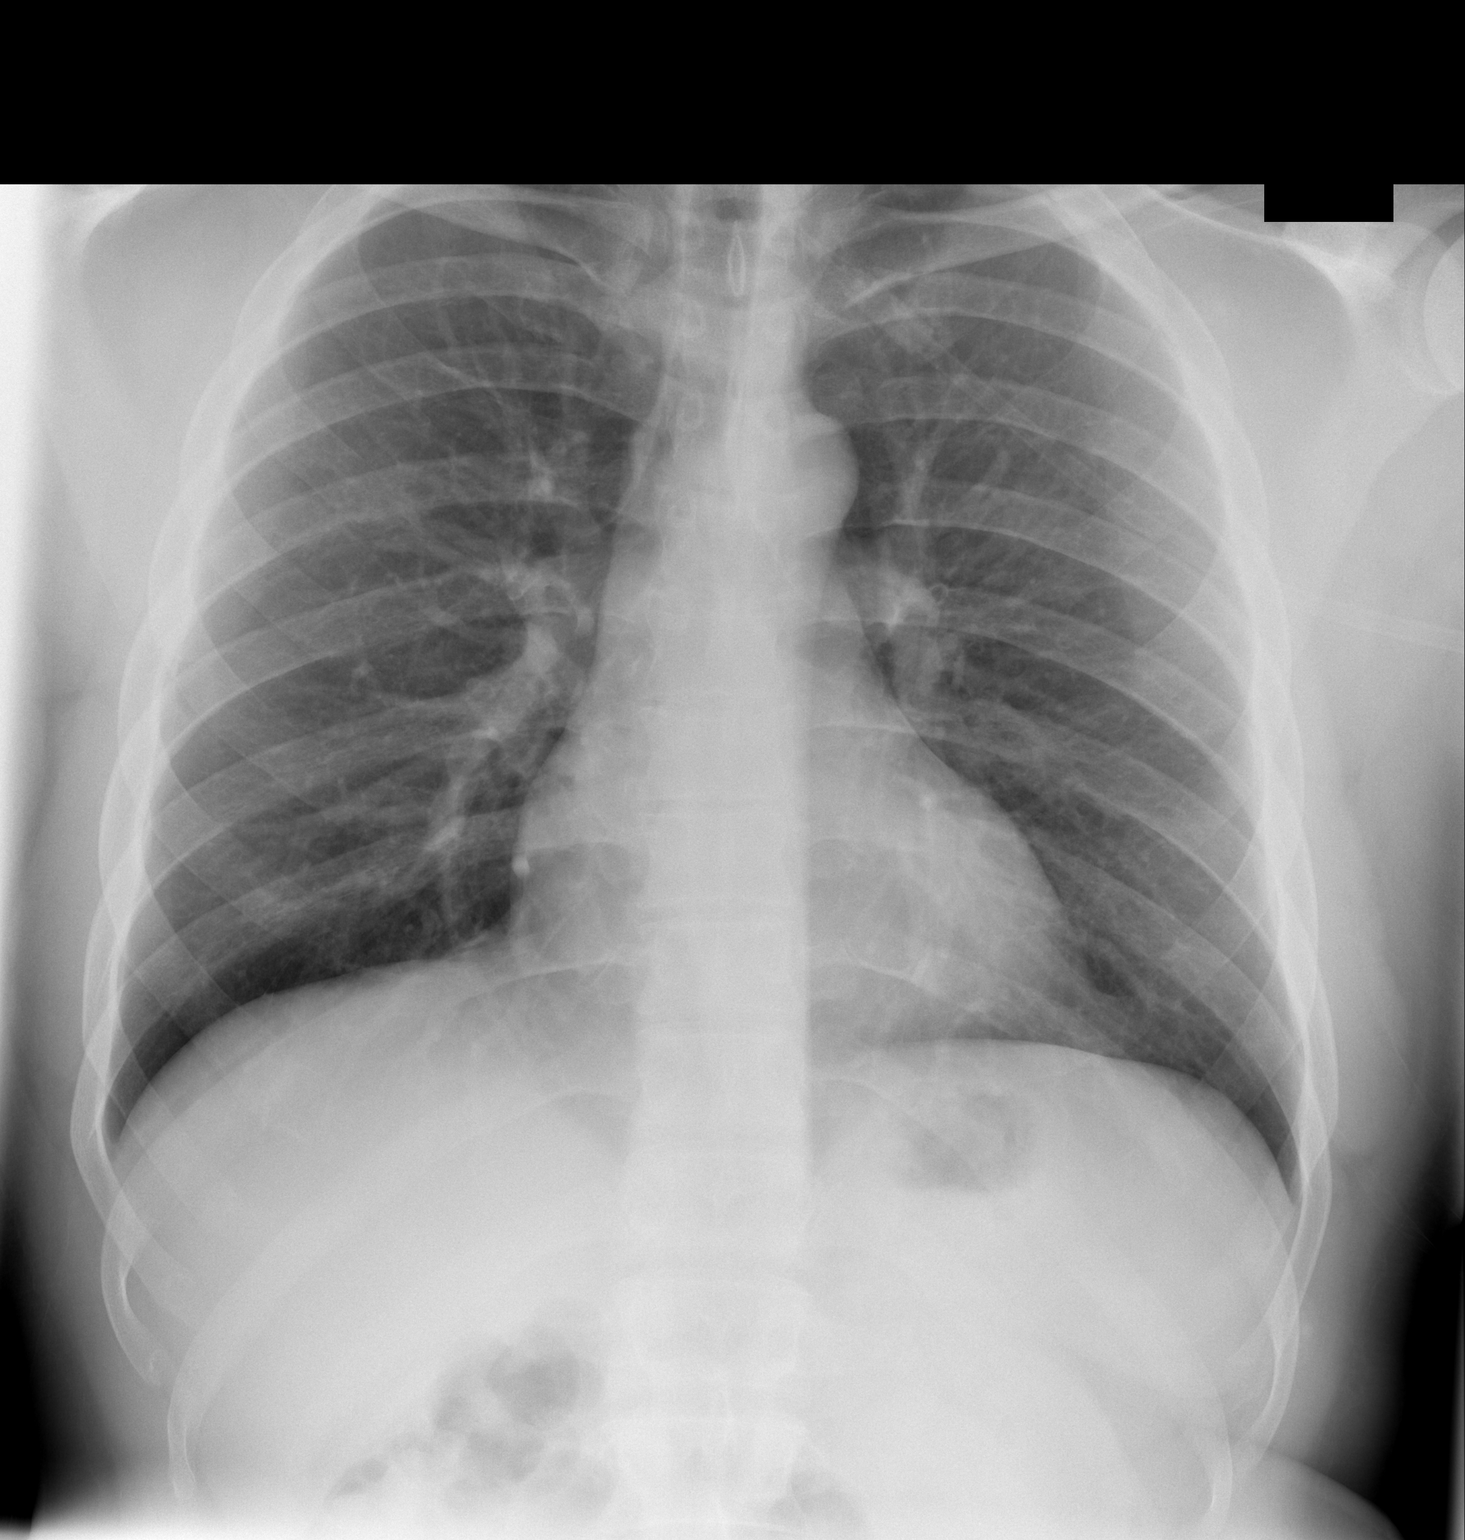

[w chest lat]
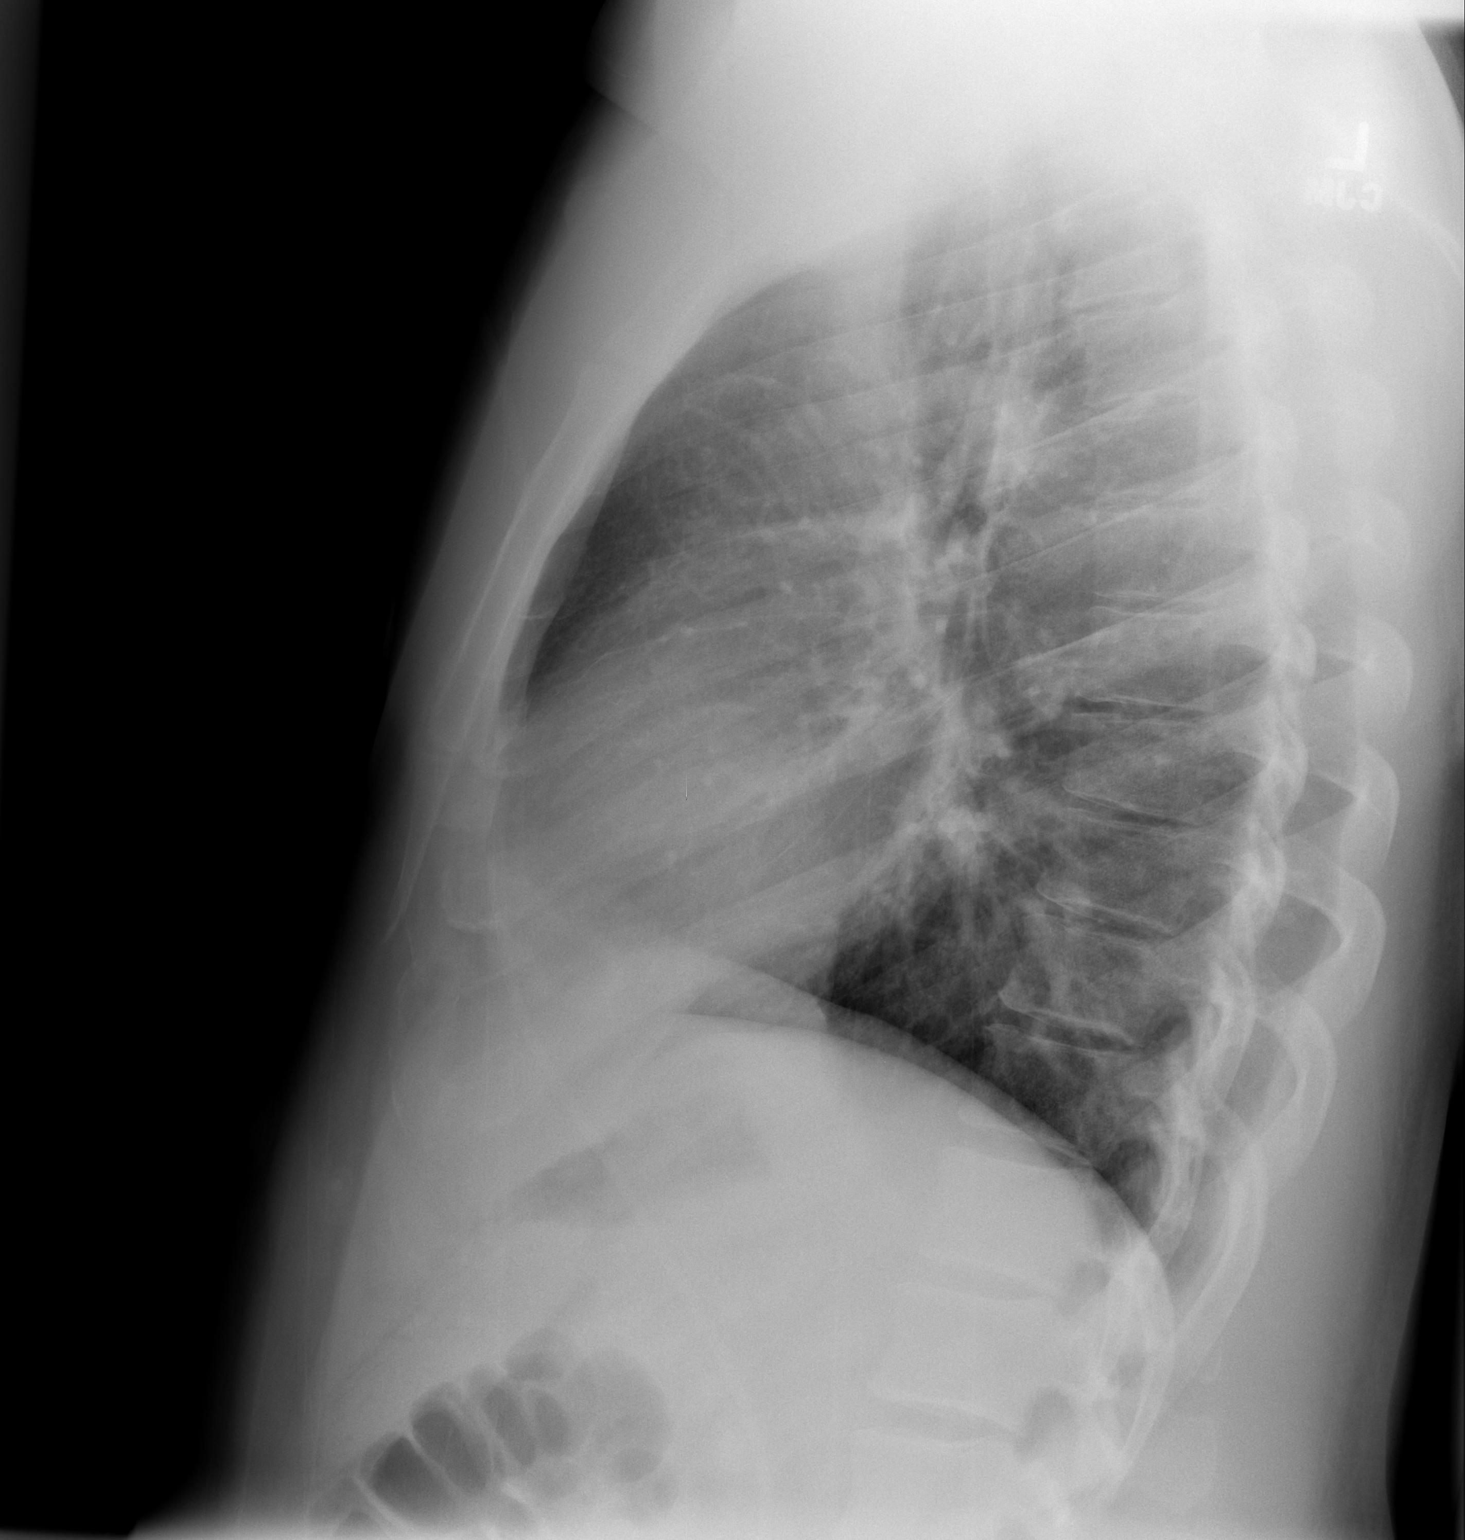

[2 of 2 positions shown; findings below may reference images not displayed]

FINDINGS: The lungs are well-aerated and clear. There is no evidence of focal
opacification, pleural effusion or pneumothorax.

The heart is normal in size; the mediastinal contour is within
normal limits. No acute osseous abnormalities are seen.
IMPRESSION: No acute cardiopulmonary process seen.

## 2015-06-19 ENCOUNTER — Emergency Department (HOSPITAL_COMMUNITY): Payer: Self-pay

## 2015-06-19 ENCOUNTER — Encounter (HOSPITAL_COMMUNITY): Payer: Self-pay | Admitting: Emergency Medicine

## 2015-06-19 ENCOUNTER — Emergency Department (HOSPITAL_COMMUNITY)
Admission: EM | Admit: 2015-06-19 | Discharge: 2015-06-19 | Disposition: A | Discharge status: Home / Self Care | Payer: Self-pay | Attending: Emergency Medicine | Admitting: Emergency Medicine

## 2015-06-19 DIAGNOSIS — Z23 Encounter for immunization: Secondary | ICD-10-CM | POA: Insufficient documentation

## 2015-06-19 DIAGNOSIS — Z720 Tobacco use: Secondary | ICD-10-CM | POA: Insufficient documentation

## 2015-06-19 DIAGNOSIS — W540XXA Bitten by dog, initial encounter: Secondary | ICD-10-CM

## 2015-06-19 DIAGNOSIS — Y9389 Activity, other specified: Secondary | ICD-10-CM | POA: Insufficient documentation

## 2015-06-19 DIAGNOSIS — Z8679 Personal history of other diseases of the circulatory system: Secondary | ICD-10-CM | POA: Insufficient documentation

## 2015-06-19 DIAGNOSIS — Z8619 Personal history of other infectious and parasitic diseases: Secondary | ICD-10-CM | POA: Insufficient documentation

## 2015-06-19 DIAGNOSIS — S61452A Open bite of left hand, initial encounter: Secondary | ICD-10-CM | POA: Insufficient documentation

## 2015-06-19 DIAGNOSIS — Y998 Other external cause status: Secondary | ICD-10-CM | POA: Insufficient documentation

## 2015-06-19 DIAGNOSIS — W57XXXA Bitten or stung by nonvenomous insect and other nonvenomous arthropods, initial encounter: Secondary | ICD-10-CM | POA: Insufficient documentation

## 2015-06-19 DIAGNOSIS — Y9289 Other specified places as the place of occurrence of the external cause: Secondary | ICD-10-CM | POA: Insufficient documentation

## 2015-06-19 MED ORDER — NICOTINE 7 MG/24HR TD PT24
7.0000 mg | MEDICATED_PATCH | Freq: Once | TRANSDERMAL | Status: DC
  Administered 2015-06-19: 7 mg via TRANSDERMAL
  Filled 2015-06-19: qty 1

## 2015-06-19 MED ORDER — CLINDAMYCIN HCL 300 MG PO CAPS
450.0000 mg | ORAL_CAPSULE | Freq: Once | ORAL | Status: AC
  Administered 2015-06-19: 450 mg via ORAL
  Filled 2015-06-19: qty 1

## 2015-06-19 MED ORDER — MORPHINE SULFATE (PF) 2 MG/ML IV SOLN: 2.0000 mg | Freq: Once | INTRAVENOUS | Status: DC

## 2015-06-19 MED ORDER — SULFAMETHOXAZOLE-TRIMETHOPRIM 800-160 MG PO TABS: 1.0000 | ORAL_TABLET | Freq: Two times a day (BID) | ORAL | Status: AC

## 2015-06-19 MED ORDER — TETANUS-DIPHTH-ACELL PERTUSSIS 5-2.5-18.5 LF-MCG/0.5 IM SUSP
0.5000 mL | Freq: Once | INTRAMUSCULAR | Status: AC
  Administered 2015-06-19: 0.5 mL via INTRAMUSCULAR
  Filled 2015-06-19: qty 0.5

## 2015-06-19 MED ORDER — SULFAMETHOXAZOLE-TRIMETHOPRIM 800-160 MG PO TABS
1.0000 | ORAL_TABLET | Freq: Once | ORAL | Status: AC
  Administered 2015-06-19: 1 via ORAL
  Filled 2015-06-19: qty 1

## 2015-06-19 MED ORDER — LIDOCAINE-EPINEPHRINE (PF) 2 %-1:200000 IJ SOLN
10.0000 mL | Freq: Once | INTRAMUSCULAR | Status: AC
  Administered 2015-06-19: 10 mL via INTRADERMAL
  Filled 2015-06-19: qty 20

## 2015-06-19 MED ORDER — OXYCODONE-ACETAMINOPHEN 5-325 MG PO TABS
1.0000 | ORAL_TABLET | Freq: Once | ORAL | Status: AC
  Administered 2015-06-19: 1 via ORAL
  Filled 2015-06-19: qty 1

## 2015-06-19 MED ORDER — CLINDAMYCIN HCL 150 MG PO CAPS: 300.0000 mg | ORAL_CAPSULE | Freq: Four times a day (QID) | ORAL | Status: AC

## 2015-06-19 NOTE — Discharge Planning (Signed)
Pt insure if dog has been vaccinated; [facility] closed at this time.Marland KitchenMarland KitchenNCM will follow up with phone call to shelter tomorrow to inquire on status of the dog.

## 2015-06-19 NOTE — ED Notes (Signed)
Patient reports he works at the [employer], he went to feed the dog and was bit on the left hand. Patient reports he is unsure if the dog was vaccinated, but states that it most likely was since it was up for adoption. Patient has bite marks present on his hand with some minor bleeding.

## 2015-06-19 NOTE — Discharge Instructions (Signed)
1. Keep wound clean and dry. Do not soak in water or get wet.  2. Take Bactrim (Trimethoprim-Sulfamethoxazole) 800/160 twice daily for 5 days 3. Take Clindamycin 300 mg (2 tablets) four times daily for 5 days. 4. Follow up for wound check 5. Call back with status of the dog. 6. Remove suture in 7-10 days.   Animal Bite An animal bite can result in a scratch on the skin, deep open cut, puncture of the skin, crush injury, or tearing away of the skin or a body part. Dogs are responsible for most animal bites. Children are bitten more often than adults. An animal bite can range from very mild to more serious. A small bite from your house pet is no cause for alarm. However, some animal bites can become infected or injure a bone or other tissue. You must seek medical care if:  The skin is broken and bleeding does not slow down or stop after 15 minutes.  The puncture is deep and difficult to clean (such as a cat bite).  Pain, warmth, redness, or pus develops around the wound.  The bite is from a stray animal or rodent. There may be a risk of rabies infection.  The bite is from a snake, raccoon, skunk, fox, coyote, or bat. There may be a risk of rabies infection.  The person bitten has a chronic illness such as diabetes, liver disease, or cancer, or the person takes medicine that lowers the immune system.  There is concern about the location and severity of the bite. It is important to clean and protect an animal bite wound right away to prevent infection. Follow these steps:  Clean the wound with plenty of water and soap.  Apply an antibiotic cream.  Apply gentle pressure over the wound with a clean towel or gauze to slow or stop bleeding.  Elevate the affected area above the heart to help stop any bleeding.  Seek medical care. Getting medical care within 8 hours of the animal bite leads to the best possible outcome. DIAGNOSIS  Your caregiver will most likely:  Take a detailed history  of the animal and the bite injury.  Perform a wound exam.  Take your medical history. Blood tests or X-rays may be performed. Sometimes, infected bite wounds are cultured and sent to a lab to identify the infectious bacteria.  TREATMENT  Medical treatment will depend on the location and type of animal bite as well as the patient's medical history. Treatment may include:  Wound care, such as cleaning and flushing the wound with saline solution, bandaging, and elevating the affected area.  Antibiotics.  Tetanus immunization.  Rabies immunization.  Leaving the wound open to heal. This is often done with animal bites, due to the high risk of infection. However, in certain cases, wound closure with stitches, wound adhesive, skin adhesive strips, or staples may be used. Infected bites that are left untreated may require intravenous (IV) antibiotics and surgical treatment in the hospital. HOME CARE INSTRUCTIONS  Follow your caregiver's instructions for wound care.  Take all medicines as directed.  If your caregiver prescribes antibiotics, take them as directed. Finish them even if you start to feel better.  Follow up with your caregiver for further exams or immunizations as directed. You may need a tetanus shot if:  You cannot remember when you had your last tetanus shot.  You have never had a tetanus shot.  The injury broke your skin. If you get a tetanus shot, your arm  may swell, get red, and feel warm to the touch. This is common and not a problem. If you need a tetanus shot and you choose not to have one, there is a rare chance of getting tetanus. Sickness from tetanus can be serious. SEEK MEDICAL CARE IF:  You notice warmth, redness, soreness, swelling, pus discharge, or a bad smell coming from the wound.  You have a red line on the skin coming from the wound.  You have a fever, chills, or a general ill feeling.  You have nausea or vomiting.  You have continued or  worsening pain.  You have trouble moving the injured part.  You have other questions or concerns. MAKE SURE YOU:  Understand these instructions.  Will watch your condition.  Will get help right away if you are not doing well or get worse. Document Released: 05/27/2011 Document Revised: 12/01/2011 Document Reviewed: 05/27/2011 [facility] Patient Information 2015 [place], [place]. This information is not intended to replace advice given to you by your health care provider. Make sure you discuss any questions you have with your health care provider.

## 2015-06-19 NOTE — ED Provider Notes (Signed)
CSN: [id number]     Arrival date & time 06/19/15  0845 History   First MD Initiated Contact with Patient 06/19/15 [phone]     Chief Complaint  Patient presents with  . Animal Bite     (Consider location/radiation/quality/duration/timing/severity/associated sxs/prior Treatment) HPI [name] is a 34 yo male with PMH substance abuse, presenting with 1 hour h/o dog bite to the left hand sustained while feeding a dog at the [employer].  He reports seeing "white meat" on the dorsal hand, with increased swelling and pain of the hand.  He is not sure if the dog has had been vaccinated for herpes.  He received his last Tetanus immunization while in prison, which he states was within the last 10 years.  Patient reports allergy to Keflex, described as "swelling all over."   Past Medical History  Diagnosis Date  . Genital herpes   . Migraines   . Polysubstance abuse   . Cocaine addiction    History reviewed. No pertinent past surgical history. No family history on file. Social History  Substance Use Topics  . Smoking status: Current Every Day Smoker -- 0.25 packs/day    Types: Cigarettes  . Smokeless tobacco: None  . Alcohol Use: No    Review of Systems  Constitutional: Negative for fever and chills.  Musculoskeletal:       Hand swelling      Allergies  Ciprofloxacin and Keflex  Home Medications   Prior to Admission medications   Not on File   BP 131/87 mmHg  Pulse 70  Temp(Src) 98.1 F (36.7 C) (Oral)  SpO2 96% Physical Exam  Constitutional: He is oriented to person, place, and time. He appears well-developed and well-nourished. No distress.  HENT:  Head: Normocephalic and atraumatic.  Eyes: EOM are normal.  Cardiovascular: Normal rate, regular rhythm, normal heart sounds and intact distal pulses.   Pulmonary/Chest: Effort normal and breath sounds normal. No respiratory distress. He has no wheezes.  Abdominal: Soft. He exhibits no distension. There is no  tenderness. There is no rebound and no guarding.  Musculoskeletal:  Left hand with slightly increased swelling compared to right.  Two small puncture wounds to dorsal hand.  0.5 cm laceration to palmar hand over thenar eminence.  Neurological: He is alert and oriented to person, place, and time.  Skin: He is not diaphoretic.    ED Course  Procedures (including critical care time) Wounds numbed with 8 mL Lidocaine with 2% Epinephrine.  Wounds vigorously irrigated with saline.  No osseus or tendon injury appreciated.  Single 4-0 nylon suture not under tension placed on dorsal hand wound.  Wound continued to drain serosanguinous fluid.  Bacitracin and pressure bandage applied and wrapped with Coban.   Labs Review Labs Reviewed - No data to display  Imaging Review Dg Hand Complete Left  06/19/2015   CLINICAL DATA:  Dog bite to the first metacarpal.  Swelling.  EXAM: LEFT HAND - COMPLETE 3+ VIEW  COMPARISON:  None.  FINDINGS: No radiopaque foreign body. Anatomic alignment. Negative for fracture. Soft tissue swelling is present diffusely over the thenar eminence and thumb.  IMPRESSION: No osseous injury or radiopaque foreign body.   Electronically Signed   By: Andreas Newport M.D.   On: 06/19/2015 09:48   I have personally reviewed and evaluated these images and lab results as part of my medical decision-making.   EKG Interpretation None      MDM   Final diagnoses:  None  Dog Bite: Xrays negative for bony injury. No tendon injury seen on irrigation and visualization. Single suture placed to dorsal hand, not under tension, with continued serosanguinous drainage.  Patient told to remove suture in 7-10 days.  Spoke with Case Management, who provided resources for patient to obtain medication assistance as well as follow up appointment for evaluation of wound.  He understands to seek medical attention for worsening swelling, pain, numbness, or purulent drainage.  Patient states he will call  tomorrow about whether the dog has been vaccinated or displayed s/sx of rabies. - Bactrim 800-160 BID for 5 days - Clindamycin 300 mg QID for 5 days - Tylenol for pain  Jana Half, MD 06/19/15 1153  Laurence Spates, MD 06/19/15 1547

## 2015-06-19 NOTE — Care Management Note (Signed)
Case Management Note  Patient Details  Name: Joshua Larson MRN: [id number] Date of Birth: May 04, 1981  Subjective/Objective:                  34 yo male in ER with dog bite.//  Lives at [facility]  Action/Plan: Follow for disposition needs  Expected Discharge Date:       06/19/15           Expected Discharge Plan:  Group Home  In-House Referral:  PCP / [facility]  Discharge planning Services  [facility] / [facility] (established/new), Follow-up appt scheduled, CM Consult  Post Acute Care Choice:  NA Choice offered to:  NA  DME Arranged:  N/A DME Agency:  NA  HH Arranged:  NA HH Agency:  NA  Status of Service:  Completed, signed off  Medicare Important Message Given:    Date Medicare IM Given:    Medicare IM give by:    Date Additional Medicare IM Given:    Additional Medicare Important Message give by:     If discussed at [place] of Stay Meetings, dates discussed:    Additional Comments: Joshua J. Clydene Laming, RN, BSN, [place] [phone] ED CM consulted regarding PCP establishment and insurance enrollment. Pt presented to [facility] ED today with dog bite. NCM met with pt at bedside; pt confirms not having access to f/u care with PCP or insurance coverage. Discussed with patient importance and benefits of establishing PCP, and not utilizing the ED for primary care needs. Pt verbalized understanding and is in agreement. Discussed other options, provided list of local  affordable PCPs.  Pt voiced interest in the [facility] and [facility].  NCM advised that [facility]  Internal Medicine providers are seeing pts at [facility]. Pt verbalized understanding. NCM set up appointment with Joshua Sickle, NP 10/12 at 1045.  Joshua Mandril, RN 06/19/2015, 10:41 AM

## 2015-06-25 ENCOUNTER — Encounter (HOSPITAL_COMMUNITY): Payer: Self-pay | Admitting: Emergency Medicine

## 2015-06-25 ENCOUNTER — Emergency Department (INDEPENDENT_AMBULATORY_CARE_PROVIDER_SITE_OTHER)
Admission: EM | Admit: 2015-06-25 | Discharge: 2015-06-25 | Disposition: A | Discharge status: Home / Self Care | Payer: Self-pay | Source: Home / Self Care

## 2015-06-25 DIAGNOSIS — T148 Other injury of unspecified body region: Secondary | ICD-10-CM

## 2015-06-25 DIAGNOSIS — IMO0002 Reserved for concepts with insufficient information to code with codable children: Secondary | ICD-10-CM

## 2015-06-25 NOTE — ED Notes (Signed)
Pt states his hand is still tender from a dog bite 6 days ago that was treated and stitched in the ED.  Pt reports some pus oozing from the wound yesterday.  He is also complaining of some back pain.

## 2015-06-25 NOTE — ED Provider Notes (Signed)
CSN: [id number]     Arrival date & time 06/25/15  1729 History   None    Chief Complaint  Patient presents with  . Wound Check  . Back Pain   (Consider location/radiation/quality/duration/timing/severity/associated sxs/prior Treatment) Patient is a 34 y.o. male presenting with wound check and back pain. The history is provided by the patient.  Wound Check This is a new problem. The current episode started more than 1 week ago. The problem occurs rarely. The problem has not changed since onset. Back Pain   Past Medical History  Diagnosis Date  . Genital herpes   . Migraines   . Polysubstance abuse   . Cocaine addiction    No past surgical history on file. No family history on file. Social History  Substance Use Topics  . Smoking status: Current Every Day Smoker -- 0.25 packs/day    Types: Cigarettes  . Smokeless tobacco: Not on file  . Alcohol Use: No    Review of Systems  Constitutional: Negative.   HENT: Negative.   Eyes: Negative.   Respiratory: Negative.   Cardiovascular: Negative.   Gastrointestinal: Negative.   Endocrine: Negative.   Genitourinary: Negative.   Musculoskeletal: Positive for back pain.  Neurological: Negative.   Hematological: Negative.   Psychiatric/Behavioral: Negative.     Allergies  Ciprofloxacin and Keflex  Home Medications   Prior to Admission medications   Medication Sig Start Date End Date Taking? Authorizing Provider  clindamycin (CLEOCIN) 150 MG capsule Take 2 capsules (300 mg total) by mouth 4 (four) times daily. 06/19/15   Jana Half, MD  sulfamethoxazole-trimethoprim (BACTRIM DS,SEPTRA DS) 800-160 MG tablet Take 1 tablet by mouth 2 (two) times daily. 06/19/15   Jana Half, MD   Meds Ordered and Administered this Visit  Medications - No data to display  BP 125/81 mmHg  Pulse 82  Temp(Src) 97.6 F (36.4 C) (Oral)  Resp 16  SpO2 100% No data found.   Physical Exam  ED Course  Procedures (including  critical care time)  Labs Review Labs Reviewed - No data to display  Imaging Review No results found.   Visual Acuity Review  Right Eye Distance:   Left Eye Distance:   Bilateral Distance:    Right Eye Near:   Left Eye Near:    Bilateral Near:         MDM  No diagnosis found.    Deatra Canter, FNP 06/25/15 2141

## 2015-06-25 NOTE — Discharge Instructions (Signed)

## 2015-07-04 ENCOUNTER — Ambulatory Visit: Payer: Self-pay | Admitting: Family Medicine

## 2017-03-21 ENCOUNTER — Emergency Department: Admission: EM | Admit: 2017-03-21 | Discharge: 2017-03-21 | Discharge status: Absent without leave | Payer: Self-pay

## 2022-02-20 DEATH — deceased
